# Patient Record
Sex: Male | Born: 1950
Health system: Southern US, Community
[De-identification: ages and names within clinical notes are randomized; demographics above are authoritative.]

## PROBLEM LIST (undated history)

## (undated) HISTORY — PX: COLONOSCOPY: SHX174

## (undated) HISTORY — PX: EYE SURGERY: SHX253

## (undated) HISTORY — PX: KNEE ARTHROSCOPY: SHX127

---

## 2006-05-21 ENCOUNTER — Ambulatory Visit: Payer: Self-pay | Admitting: Internal Medicine

## 2006-05-21 LAB — CONVERTED CEMR LAB
AST: 23 units/L (ref 0–37)
Albumin: 4 g/dL (ref 3.5–5.2)
Basophils Absolute: 0 10*3/uL (ref 0.0–0.1)
CO2: 31 meq/L (ref 19–32)
Cholesterol: 221 mg/dL (ref 0–200)
Creatinine, Ser: 0.9 mg/dL (ref 0.4–1.5)
GFR calc non Af Amer: 94 mL/min
HCT: 45.8 % (ref 39.0–52.0)
HDL: 70.8 mg/dL (ref >39.0)
MCHC: 32.7 g/dL (ref 30.0–36.0)
Monocytes Relative: 10.2 % (ref 3.0–11.0)
Neutro Abs: 3.4 10*3/uL (ref 1.4–7.7)
PSA: 1.2 ng/mL (ref 0.10–4.00)
Platelets: 226 10*3/uL (ref 150–400)
RBC: 4.82 M/uL (ref 4.22–5.81)
RDW: 12.9 % (ref 11.5–14.6)
Sodium: 140 meq/L (ref 135–145)
Total Bilirubin: 0.7 mg/dL (ref 0.3–1.2)
VLDL: 13 mg/dL (ref 0–40)

## 2006-05-28 ENCOUNTER — Ambulatory Visit: Payer: Self-pay | Admitting: Internal Medicine

## 2006-12-07 DIAGNOSIS — IMO0002 Reserved for concepts with insufficient information to code with codable children: Secondary | ICD-10-CM | POA: Insufficient documentation

## 2007-01-05 ENCOUNTER — Ambulatory Visit: Payer: Self-pay | Admitting: Family Medicine

## 2007-06-30 ENCOUNTER — Ambulatory Visit (HOSPITAL_BASED_OUTPATIENT_CLINIC_OR_DEPARTMENT_OTHER): Admission: RE | Admit: 2007-06-30 | Discharge: 2007-06-30 | Payer: Self-pay | Admitting: Orthopedic Surgery

## 2007-07-06 ENCOUNTER — Telehealth: Payer: Self-pay | Admitting: Internal Medicine

## 2010-07-14 ENCOUNTER — Emergency Department: Payer: Self-pay | Admitting: Emergency Medicine

## 2010-09-17 NOTE — Op Note (Signed)
NAME:  Daniel Velasquez, Daniel Velasquez               ACCOUNT NO.:  [id number]   MEDICAL RECORD NO.:  [id number]          PATIENT TYPE:  AMB   LOCATION:  [facility]                         FACILITY:  [facility]   PHYSICIAN:  Ollen Gross, M.D.    DATE OF BIRTH:  03-29-1951   DATE OF PROCEDURE:  06/30/2007  DATE OF DISCHARGE:                               OPERATIVE REPORT   PREOPERATIVE DIAGNOSIS:  Right knee medial meniscal tear.   POSTOPERATIVE DIAGNOSIS:  Right knee medial meniscal tear.   PROCEDURE:  Right knee arthroscopy with medial meniscal debridement.   SURGEON:  Ollen Gross, M.D.   ASSISTANT:  None.   ANESTHESIA:  General.   BLOOD LOSS:  Minimal.   DRAINS:  None.   COMPLICATIONS:  None.   CONDITION:  Stable to recovery room.   CLINICAL NOTE:  [name] is a 60 year old male with several month history of  right knee pain and mechanical symptoms.  Exam and history suggested  medial meniscal tear confirmed by MRI.  He presents now for arthroscopy  and debridement.   PROCEDURE IN DETAIL:  After the successful administration of general  anesthetic, a tourniquet is placed high on the right thigh and right  lower extremity prepped and draped in the usual sterile fashion.  Standard superomedial and inferolateral incisions are made.  Inflow  cannula passed superomedial and camera passed inferolateral.  Arthroscopic visualization proceeds.  Undersurface of  the patella and  trochlea look normal.  The medial and lateral gutters are visualized,  there are no loose bodies.  Flexion and valgus force is applied to the  knee and medial compartment is entered.  There is a very bad tear in the  body and posterior horn of the medial meniscus with a small bucket  handle component which is flipped underneath the meniscus posteriorly.  The chondral surfaces look normal.  Flexion and valgus force applied to  the knee and the medial compartment is entered.  Small incision is made  and dilator is placed.  The  meniscus is debrided back to a stable base  and the bucket handle component removed.  I used the baskets and a 4.2  mm shaver to stabilize the meniscus and then sealed off the edge with  the ArthroCare device.  It is found to be stable throughout.  The  chondral surfaces remain normal in appearance.  Intercondylar notch is  visualized and ACL exhibited no further tears, loose bodies or chondral  defects noted.  Arthroscopic equipment is  removed from the inferior portals which are closed with interrupted 4-0  nylon.  I injected 20 mL of 25% Marcaine with epinephrine  through the  inflow cannula, then that is removed and that portal closed with nylon.  Bulky sterile dressings applied and he is awakened and transported to  recovery in stable condition.      Ollen Gross, M.D.  Electronically Signed     FA/MEDQ  D:  06/30/2007  T:  07/01/2007  Job:  [id number]

## 2010-09-20 NOTE — Assessment & Plan Note (Signed)
[facility] OFFICE NOTE   WOODS, GANGEMI                        MRN:          [id number]  DATE:05/28/2006                            DOB:          04-May-1951    A 60 year old gentleman who is seen today to establish with our  practice.  He has enjoyed reasonably good health.  A number of years ago  did have a ruptured lumbar disk.  For the past 5 years or so, this has  been much improved.  He does have a prescription for p.r.n. oxycodone.  He has a history of a systolic murmur in the past.  He has had a remote  tonsillectomy in 1959.  He has no known allergies.   MEDICAL REGIMEN:  1. Oxycodone 5 mg p.r.n.  2. Famvir 125 p.r.n.  3. Patanol eye drops.   FAMILY HISTORY:  Both parents are living in their mid 37s.  Father with  osteoarthritis.  Mother with hypercholesterolemia.  Paternal grandfather  died of lung cancer.  One brother has recurrent DVTs on chronic  Coumadin.   EXAM:  Revealed a healthy-appearing fit male in no acute distress.  Blood pressure was 120/78.  FUNDI, EARS, NOSE, AND THROAT:  Clear.  NECK:  No adenopathy or bruits.  CHEST:  Clear.  CARDIOVASCULAR:  S1 and S2 are normal.  There is no audible murmur.  ABDOMEN:  Benign.  No organomegaly.  EXTERNAL GENITALIA:  Normal.  RECTAL:  Prostate small and benign.  Stool heme-negative.  EXTREMITIES:  Full peripheral pulses.  No edema.   IMPRESSION:  1. Intermittent low back pain.  2. Mild hypercholesterolemia with a total cholesterol of 221, HDL of      70.8.   DISPOSITION:  Medical regimen unchanged.  He has had screening  colonoscopy in 2006.  Will return in 1 year or p.r.n.     Gordy Savers, MD  Electronically Signed    PFK/MedQ  DD: 05/28/2006  DT: 05/28/2006  Job #: [id number]

## 2014-03-02 ENCOUNTER — Ambulatory Visit
Admission: RE | Admit: 2014-03-02 | Discharge: 2014-03-02 | Disposition: A | Discharge status: Home / Self Care | Payer: BC Managed Care – PPO | Source: Ambulatory Visit | Attending: Physician Assistant | Admitting: Physician Assistant

## 2014-03-02 ENCOUNTER — Other Ambulatory Visit: Payer: Self-pay | Admitting: Physician Assistant

## 2014-03-02 DIAGNOSIS — R079 Chest pain, unspecified: Secondary | ICD-10-CM

## 2017-12-02 ENCOUNTER — Encounter: Payer: Self-pay | Admitting: Gastroenterology

## 2018-04-08 DIAGNOSIS — L570 Actinic keratosis: Secondary | ICD-10-CM | POA: Diagnosis not present

## 2018-04-08 DIAGNOSIS — L218 Other seborrheic dermatitis: Secondary | ICD-10-CM | POA: Diagnosis not present

## 2018-04-08 DIAGNOSIS — L821 Other seborrheic keratosis: Secondary | ICD-10-CM | POA: Diagnosis not present

## 2018-04-08 DIAGNOSIS — D225 Melanocytic nevi of trunk: Secondary | ICD-10-CM | POA: Diagnosis not present

## 2018-05-17 DIAGNOSIS — H43391 Other vitreous opacities, right eye: Secondary | ICD-10-CM | POA: Diagnosis not present

## 2018-05-17 DIAGNOSIS — H33322 Round hole, left eye: Secondary | ICD-10-CM | POA: Diagnosis not present

## 2018-05-17 DIAGNOSIS — H4312 Vitreous hemorrhage, left eye: Secondary | ICD-10-CM | POA: Diagnosis not present

## 2018-05-17 DIAGNOSIS — H33022 Retinal detachment with multiple breaks, left eye: Secondary | ICD-10-CM | POA: Diagnosis not present

## 2018-05-19 DIAGNOSIS — H33322 Round hole, left eye: Secondary | ICD-10-CM | POA: Diagnosis not present

## 2018-08-06 ENCOUNTER — Other Ambulatory Visit: Payer: Self-pay | Admitting: Gastroenterology

## 2018-08-06 DIAGNOSIS — R109 Unspecified abdominal pain: Secondary | ICD-10-CM

## 2018-08-10 ENCOUNTER — Other Ambulatory Visit: Payer: Self-pay

## 2018-08-10 ENCOUNTER — Ambulatory Visit
Admission: RE | Admit: 2018-08-10 | Discharge: 2018-08-10 | Disposition: A | Discharge status: Home / Self Care | Payer: Medicare Other | Source: Ambulatory Visit | Attending: Gastroenterology | Admitting: Gastroenterology

## 2018-08-10 DIAGNOSIS — R109 Unspecified abdominal pain: Secondary | ICD-10-CM

## 2018-08-10 DIAGNOSIS — K5730 Diverticulosis of large intestine without perforation or abscess without bleeding: Secondary | ICD-10-CM | POA: Diagnosis not present

## 2018-08-10 MED ORDER — IOPAMIDOL (ISOVUE-300) INJECTION 61%
100.0000 mL | Freq: Once | INTRAVENOUS | Status: AC | PRN
  Administered 2018-08-10: 09:00:00 100 mL via INTRAVENOUS

## 2018-08-25 DIAGNOSIS — K5792 Diverticulitis of intestine, part unspecified, without perforation or abscess without bleeding: Secondary | ICD-10-CM | POA: Diagnosis not present

## 2018-08-27 DIAGNOSIS — R339 Retention of urine, unspecified: Secondary | ICD-10-CM | POA: Diagnosis not present

## 2018-09-07 DIAGNOSIS — H4312 Vitreous hemorrhage, left eye: Secondary | ICD-10-CM | POA: Diagnosis not present

## 2018-09-07 DIAGNOSIS — H43391 Other vitreous opacities, right eye: Secondary | ICD-10-CM | POA: Diagnosis not present

## 2018-09-07 DIAGNOSIS — H33022 Retinal detachment with multiple breaks, left eye: Secondary | ICD-10-CM | POA: Diagnosis not present

## 2018-09-07 DIAGNOSIS — H33322 Round hole, left eye: Secondary | ICD-10-CM | POA: Diagnosis not present

## 2018-09-29 DIAGNOSIS — L219 Seborrheic dermatitis, unspecified: Secondary | ICD-10-CM | POA: Diagnosis not present

## 2018-09-29 DIAGNOSIS — M545 Low back pain: Secondary | ICD-10-CM | POA: Diagnosis not present

## 2018-09-29 DIAGNOSIS — Z125 Encounter for screening for malignant neoplasm of prostate: Secondary | ICD-10-CM | POA: Diagnosis not present

## 2018-09-29 DIAGNOSIS — E7800 Pure hypercholesterolemia, unspecified: Secondary | ICD-10-CM | POA: Diagnosis not present

## 2018-10-07 DIAGNOSIS — H33022 Retinal detachment with multiple breaks, left eye: Secondary | ICD-10-CM | POA: Diagnosis not present

## 2018-10-07 DIAGNOSIS — H4312 Vitreous hemorrhage, left eye: Secondary | ICD-10-CM | POA: Diagnosis not present

## 2018-10-07 DIAGNOSIS — H43812 Vitreous degeneration, left eye: Secondary | ICD-10-CM | POA: Diagnosis not present

## 2018-10-07 DIAGNOSIS — Z125 Encounter for screening for malignant neoplasm of prostate: Secondary | ICD-10-CM | POA: Diagnosis not present

## 2018-10-07 DIAGNOSIS — E7800 Pure hypercholesterolemia, unspecified: Secondary | ICD-10-CM | POA: Diagnosis not present

## 2018-10-07 DIAGNOSIS — H33322 Round hole, left eye: Secondary | ICD-10-CM | POA: Diagnosis not present

## 2018-10-07 DIAGNOSIS — H43391 Other vitreous opacities, right eye: Secondary | ICD-10-CM | POA: Diagnosis not present

## 2018-10-28 DIAGNOSIS — L570 Actinic keratosis: Secondary | ICD-10-CM | POA: Diagnosis not present

## 2018-10-28 DIAGNOSIS — D485 Neoplasm of uncertain behavior of skin: Secondary | ICD-10-CM | POA: Diagnosis not present

## 2018-10-28 DIAGNOSIS — L821 Other seborrheic keratosis: Secondary | ICD-10-CM | POA: Diagnosis not present

## 2018-10-28 DIAGNOSIS — D225 Melanocytic nevi of trunk: Secondary | ICD-10-CM | POA: Diagnosis not present

## 2018-10-28 DIAGNOSIS — D1801 Hemangioma of skin and subcutaneous tissue: Secondary | ICD-10-CM | POA: Diagnosis not present

## 2018-10-28 DIAGNOSIS — L989 Disorder of the skin and subcutaneous tissue, unspecified: Secondary | ICD-10-CM | POA: Diagnosis not present

## 2019-01-20 DIAGNOSIS — L438 Other lichen planus: Secondary | ICD-10-CM | POA: Diagnosis not present

## 2019-01-20 DIAGNOSIS — L814 Other melanin hyperpigmentation: Secondary | ICD-10-CM | POA: Diagnosis not present

## 2019-01-20 DIAGNOSIS — L111 Transient acantholytic dermatosis [Grover]: Secondary | ICD-10-CM | POA: Diagnosis not present

## 2019-01-20 DIAGNOSIS — L565 Disseminated superficial actinic porokeratosis (DSAP): Secondary | ICD-10-CM | POA: Diagnosis not present

## 2019-01-20 DIAGNOSIS — D225 Melanocytic nevi of trunk: Secondary | ICD-10-CM | POA: Diagnosis not present

## 2019-01-20 DIAGNOSIS — D1801 Hemangioma of skin and subcutaneous tissue: Secondary | ICD-10-CM | POA: Diagnosis not present

## 2019-01-20 DIAGNOSIS — L821 Other seborrheic keratosis: Secondary | ICD-10-CM | POA: Diagnosis not present

## 2019-04-04 DIAGNOSIS — E7800 Pure hypercholesterolemia, unspecified: Secondary | ICD-10-CM | POA: Diagnosis not present

## 2019-04-04 DIAGNOSIS — M545 Low back pain: Secondary | ICD-10-CM | POA: Diagnosis not present

## 2019-04-04 DIAGNOSIS — L219 Seborrheic dermatitis, unspecified: Secondary | ICD-10-CM | POA: Diagnosis not present

## 2019-04-04 DIAGNOSIS — K4090 Unilateral inguinal hernia, without obstruction or gangrene, not specified as recurrent: Secondary | ICD-10-CM | POA: Diagnosis not present

## 2019-08-04 DIAGNOSIS — H2513 Age-related nuclear cataract, bilateral: Secondary | ICD-10-CM | POA: Diagnosis not present

## 2019-08-04 DIAGNOSIS — H35413 Lattice degeneration of retina, bilateral: Secondary | ICD-10-CM | POA: Diagnosis not present

## 2019-08-04 DIAGNOSIS — H40013 Open angle with borderline findings, low risk, bilateral: Secondary | ICD-10-CM | POA: Diagnosis not present

## 2019-08-04 DIAGNOSIS — H33312 Horseshoe tear of retina without detachment, left eye: Secondary | ICD-10-CM | POA: Diagnosis not present

## 2019-10-17 DIAGNOSIS — L509 Urticaria, unspecified: Secondary | ICD-10-CM | POA: Diagnosis not present

## 2019-10-25 DIAGNOSIS — M545 Low back pain: Secondary | ICD-10-CM | POA: Diagnosis not present

## 2019-10-25 DIAGNOSIS — Z125 Encounter for screening for malignant neoplasm of prostate: Secondary | ICD-10-CM | POA: Diagnosis not present

## 2019-10-25 DIAGNOSIS — E7800 Pure hypercholesterolemia, unspecified: Secondary | ICD-10-CM | POA: Diagnosis not present

## 2019-10-25 DIAGNOSIS — L309 Dermatitis, unspecified: Secondary | ICD-10-CM | POA: Diagnosis not present

## 2020-02-09 DIAGNOSIS — D692 Other nonthrombocytopenic purpura: Secondary | ICD-10-CM | POA: Diagnosis not present

## 2020-02-09 DIAGNOSIS — L814 Other melanin hyperpigmentation: Secondary | ICD-10-CM | POA: Diagnosis not present

## 2020-02-09 DIAGNOSIS — L565 Disseminated superficial actinic porokeratosis (DSAP): Secondary | ICD-10-CM | POA: Diagnosis not present

## 2020-02-09 DIAGNOSIS — L570 Actinic keratosis: Secondary | ICD-10-CM | POA: Diagnosis not present

## 2020-02-09 DIAGNOSIS — C44319 Basal cell carcinoma of skin of other parts of face: Secondary | ICD-10-CM | POA: Diagnosis not present

## 2020-04-07 DIAGNOSIS — Z0120 Encounter for dental examination and cleaning without abnormal findings: Secondary | ICD-10-CM | POA: Diagnosis not present

## 2020-04-12 DIAGNOSIS — Z0120 Encounter for dental examination and cleaning without abnormal findings: Secondary | ICD-10-CM | POA: Diagnosis not present

## 2020-04-19 DIAGNOSIS — H25013 Cortical age-related cataract, bilateral: Secondary | ICD-10-CM | POA: Diagnosis not present

## 2020-04-19 DIAGNOSIS — H2513 Age-related nuclear cataract, bilateral: Secondary | ICD-10-CM | POA: Diagnosis not present

## 2020-04-19 DIAGNOSIS — H04123 Dry eye syndrome of bilateral lacrimal glands: Secondary | ICD-10-CM | POA: Diagnosis not present

## 2020-04-19 DIAGNOSIS — H40013 Open angle with borderline findings, low risk, bilateral: Secondary | ICD-10-CM | POA: Diagnosis not present

## 2020-05-15 DIAGNOSIS — L219 Seborrheic dermatitis, unspecified: Secondary | ICD-10-CM | POA: Diagnosis not present

## 2020-05-15 DIAGNOSIS — Z0000 Encounter for general adult medical examination without abnormal findings: Secondary | ICD-10-CM | POA: Diagnosis not present

## 2020-05-15 DIAGNOSIS — M545 Low back pain, unspecified: Secondary | ICD-10-CM | POA: Diagnosis not present

## 2020-05-15 DIAGNOSIS — K4090 Unilateral inguinal hernia, without obstruction or gangrene, not specified as recurrent: Secondary | ICD-10-CM | POA: Diagnosis not present

## 2020-06-26 ENCOUNTER — Encounter (INDEPENDENT_AMBULATORY_CARE_PROVIDER_SITE_OTHER): Payer: Self-pay | Admitting: Ophthalmology

## 2020-06-26 ENCOUNTER — Ambulatory Visit (INDEPENDENT_AMBULATORY_CARE_PROVIDER_SITE_OTHER): Payer: Medicare Other | Admitting: Ophthalmology

## 2020-06-26 ENCOUNTER — Other Ambulatory Visit: Payer: Self-pay

## 2020-06-26 DIAGNOSIS — H4312 Vitreous hemorrhage, left eye: Secondary | ICD-10-CM

## 2020-06-26 DIAGNOSIS — H35411 Lattice degeneration of retina, right eye: Secondary | ICD-10-CM

## 2020-06-26 DIAGNOSIS — H33022 Retinal detachment with multiple breaks, left eye: Secondary | ICD-10-CM

## 2020-06-26 DIAGNOSIS — H2513 Age-related nuclear cataract, bilateral: Secondary | ICD-10-CM | POA: Diagnosis not present

## 2020-06-26 DIAGNOSIS — H33322 Round hole, left eye: Secondary | ICD-10-CM | POA: Insufficient documentation

## 2020-06-26 DIAGNOSIS — H2512 Age-related nuclear cataract, left eye: Secondary | ICD-10-CM | POA: Insufficient documentation

## 2020-06-26 NOTE — Assessment & Plan Note (Signed)
This condition resolved from the past not present at this time

## 2020-06-26 NOTE — Assessment & Plan Note (Signed)
Retina attached, no new findings, good buckle superiorly no new breaks

## 2020-06-26 NOTE — Progress Notes (Addendum)
06/26/2020     CHIEF COMPLAINT Patient presents for Retina Follow Up (Pt referred for cataract sx clearance by Dr. Serita Kyle was last seen in 10/2018. Pt c/o blurry vision. He suspects it is due to OS cataract. )   HISTORY OF PRESENT ILLNESS: Daniel Velasquez is a 70 y.o. male who presents to the clinic today for:   HPI    Retina Follow Up    Patient presents with  Retinal Break/Detachment.  In left eye.  Severity is moderate.  Duration of 2 years.  Since onset it is stable.  I, the attending physician,  performed the HPI with the patient and updated documentation appropriately. Additional comments: Pt referred for cataract sx clearance by Dr. Herbert Deaner. Pt was last seen in 10/2018. Pt c/o blurry vision. He suspects it is due to OS cataract.        Last edited by Tilda Franco on 06/26/2020  2:10 PM. (History)      Referring physician: No referring provider defined for this encounter.  HISTORICAL INFORMATION:   Selected notes from the MEDICAL RECORD NUMBER       CURRENT MEDICATIONS: Current Outpatient Medications (Ophthalmic Drugs)  Medication Sig  . cycloSPORINE (RESTASIS) 0.05 % ophthalmic emulsion 1 drop in each eye   No current facility-administered medications for this visit. (Ophthalmic Drugs)   No current outpatient medications on file. (Other)   No current facility-administered medications for this visit. (Other)      REVIEW OF SYSTEMS:    ALLERGIES Allergies  Allergen Reactions  . Ibuprofen     PAST MEDICAL HISTORY History reviewed. No pertinent past medical history. History reviewed. No pertinent surgical history.  FAMILY HISTORY History reviewed. No pertinent family history.  SOCIAL HISTORY Social History   Tobacco Use  . Smoking status: Light Tobacco Smoker         OPHTHALMIC EXAM:  Base Eye Exam    Visual Acuity (Snellen - Linear)      Right Left   Dist cc 20/80 -2 20/30   Dist ph cc 20/50 -2        Tonometry (Tonopen, 2:17  PM)      Right Left   Pressure 18 15       Pupils      Pupils Dark Light Shape React APD   Right PERRL 4 3 Round Brisk None   Left PERRL 4 3 Round Brisk None       Visual Fields (Counting fingers)      Left Right    Full Full       Neuro/Psych    Oriented x3: Yes   Mood/Affect: Normal       Dilation    Both eyes: 1.0% Mydriacyl, 2.5% Phenylephrine @ 2:17 PM        Slit Lamp and Fundus Exam    External Exam      Right Left   External Normal Normal       Slit Lamp Exam      Right Left   Lids/Lashes Normal Normal   Conjunctiva/Sclera White and quiet White and quiet   Cornea Clear Clear   Anterior Chamber Deep and quiet Deep and quiet   Iris Round and reactive Round and reactive   Lens 3+ Nuclear sclerosis 2+ Nuclear sclerosis   Anterior Vitreous Normal Normal       Fundus Exam      Right Left   Posterior Vitreous Posterior vitreous detachment Posterior vitreous detachment, Central vitreous floaters  Disc Normal Normal   C/D Ratio 0.4 0.45   Macula Normal Normal   Vessels Normal Normal   Periphery Degeneration inferotemporal at 7 and 8 position, no breaks Good buckle, attached 360, good cryo superiorly          IMAGING AND PROCEDURES  Imaging and Procedures for 06/26/20  Color Fundus Photography TopCon - OU - Both Eyes       Right Eye Progression has been stable. Disc findings include normal observations. Macula : normal observations. Vessels : normal observations.   Left Eye Progression has been stable. Disc findings include normal observations. Macula : normal observations. Vessels : normal observations.   Notes Lattice degeneration inferotemporal right eye  Less, previous scleral buckle with retinal cryopexy superiorly                ASSESSMENT/PLAN:  Lattice degeneration, right eye Lattice degeneration right eye with a fellow eye retinal detachment and with needing cataract surgery in the right eye in the near future.  Offered  laser retinopexy as a prophylactic measure in this right eye OS to minimize the risk of developing a retinal detachment in the right eye.  He does not have a driver today, will schedule  Retinal detachment of left eye with multiple breaks Retina attached, no new findings, good buckle superiorly no new breaks  Nuclear sclerotic cataract of both eyes Dense nuclear sclerosis OU much worse on clinical biomicroscopic examination OD today.  Vitreous hemorrhage of left eye ([place]) This condition resolved from the past not present at this time      ICD-10-CM   1. Lattice degeneration, right eye  H35.411   2. Retinal detachment of left eye with multiple breaks  H33.022   3. Nuclear sclerotic cataract of both eyes  H25.13   4. Vitreous hemorrhage of left eye (HCC)  H43.12 Color Fundus Photography TopCon - OU - Both Eyes  5. Round hole of left eye  H33.322 Color Fundus Photography TopCon - OU - Both Eyes    1.  Lattice degeneration right eye, with fellow eye retinal detachment, will add laser retinopexy to the identified areas today.  2.  OS previous history of retinal detachment, no new findings  3.  OU with nuclear sclerotic cataract, OU deserves to have cataract surgery with intraocular lens placement particularly the right eye due to lens opacity seen today  Follow-up Dr. Baldemar Lenis as scheduled  Ophthalmic Meds Ordered this visit:  No orders of the defined types were placed in this encounter.      Return in about 1 week (around 07/03/2020) for dilate, COLOR FP, RETINOPEXY, OD.  There are no Patient Instructions on file for this visit.   Explained the diagnoses, plan, and follow up with the patient and they expressed understanding.  Patient expressed understanding of the importance of proper follow up care.   Clent Demark Rankin M.D. Diseases & Surgery of the Retina and Vitreous Retina & Diabetic [place] 06/26/20     Abbreviations: M myopia (nearsighted); A astigmatism; H  hyperopia (farsighted); P presbyopia; Mrx spectacle prescription;  CTL contact lenses; OD right eye; OS left eye; OU both eyes  XT exotropia; ET esotropia; PEK punctate epithelial keratitis; PEE punctate epithelial erosions; DES dry eye syndrome; MGD meibomian gland dysfunction; ATs artificial tears; PFAT's preservative free artificial tears; [place] nuclear sclerotic cataract; PSC posterior subcapsular cataract; ERM epi-retinal membrane; PVD posterior vitreous detachment; RD retinal detachment; DM diabetes mellitus; DR diabetic retinopathy; NPDR non-proliferative diabetic retinopathy; PDR proliferative  diabetic retinopathy; CSME clinically significant macular edema; DME diabetic macular edema; dbh dot blot hemorrhages; CWS cotton wool spot; POAG primary open angle glaucoma; C/D cup-to-disc ratio; HVF humphrey visual field; GVF goldmann visual field; OCT optical coherence tomography; IOP intraocular pressure; BRVO Branch retinal vein occlusion; CRVO central retinal vein occlusion; CRAO central retinal artery occlusion; BRAO branch retinal artery occlusion; RT retinal tear; SB scleral buckle; PPV pars plana vitrectomy; VH Vitreous hemorrhage; PRP panretinal laser photocoagulation; IVK intravitreal kenalog; VMT vitreomacular traction; MH Macular hole;  NVD neovascularization of the disc; NVE neovascularization elsewhere; AREDS age related eye disease study; ARMD age related macular degeneration; POAG primary open angle glaucoma; EBMD epithelial/anterior basement membrane dystrophy; ACIOL anterior chamber intraocular lens; IOL intraocular lens; PCIOL posterior chamber intraocular lens; Phaco/IOL phacoemulsification with intraocular lens placement; [place] photorefractive keratectomy; LASIK laser assisted in situ keratomileusis; HTN hypertension; DM diabetes mellitus; COPD chronic obstructive pulmonary disease

## 2020-06-26 NOTE — Assessment & Plan Note (Signed)
Dense nuclear sclerosis OU much worse on clinical biomicroscopic examination OD today.

## 2020-06-26 NOTE — Assessment & Plan Note (Signed)
Lattice degeneration right eye with a fellow eye retinal detachment and with needing cataract surgery in the right eye in the near future.  Offered laser retinopexy as a prophylactic measure in this right eye OS to minimize the risk of developing a retinal detachment in the right eye.  He does not have a driver today, will schedule

## 2020-07-03 ENCOUNTER — Ambulatory Visit (INDEPENDENT_AMBULATORY_CARE_PROVIDER_SITE_OTHER): Payer: Medicare Other | Admitting: Ophthalmology

## 2020-07-03 ENCOUNTER — Encounter (INDEPENDENT_AMBULATORY_CARE_PROVIDER_SITE_OTHER): Payer: Self-pay | Admitting: Ophthalmology

## 2020-07-03 ENCOUNTER — Other Ambulatory Visit: Payer: Self-pay

## 2020-07-03 DIAGNOSIS — H35411 Lattice degeneration of retina, right eye: Secondary | ICD-10-CM

## 2020-07-03 NOTE — Assessment & Plan Note (Signed)
Degeneration right eye inferotemporal, history of retinal eye detachment, treatment today for prophylaxis retinal tear or detachment

## 2020-07-03 NOTE — Progress Notes (Signed)
07/03/2020     CHIEF COMPLAINT Patient presents for Retina Follow Up (Retinopexy OD and FP/Pt denies issues or complaints.)   HISTORY OF PRESENT ILLNESS: Daniel Velasquez is a 70 y.o. male who presents to the clinic today for:   HPI    Retina Follow Up    Diagnosis: Lattice Degeneration.  In right eye.  Severity is moderate.  Duration of 1 week.  Since onset it is stable.  I, the attending physician,  performed the HPI with the patient and updated documentation appropriately. Additional comments: Retinopexy OD and FP Pt denies issues or complaints.       Last edited by Tilda Franco on 07/03/2020 10:01 AM. (History)      Referring physician: Arlyss Queen, MD [address] [place],  [place] [address]  HISTORICAL INFORMATION:   Selected notes from the MEDICAL RECORD NUMBER       CURRENT MEDICATIONS: Current Outpatient Medications (Ophthalmic Drugs)  Medication Sig  . cycloSPORINE (RESTASIS) 0.05 % ophthalmic emulsion 1 drop in each eye   No current facility-administered medications for this visit. (Ophthalmic Drugs)   No current outpatient medications on file. (Other)   No current facility-administered medications for this visit. (Other)      REVIEW OF SYSTEMS:    ALLERGIES Allergies  Allergen Reactions  . Ibuprofen     PAST MEDICAL HISTORY History reviewed. No pertinent past medical history. History reviewed. No pertinent surgical history.  FAMILY HISTORY History reviewed. No pertinent family history.  SOCIAL HISTORY Social History   Tobacco Use  . Smoking status: Light Tobacco Smoker         OPHTHALMIC EXAM:  Base Eye Exam    Visual Acuity (Snellen - Linear)      Right Left   Dist cc 20/400 20/30 +2   Dist ph cc 20/40 -1    Correction: Glasses       Tonometry (Tonopen, 10:05 AM)      Right Left   Pressure 19 15       Neuro/Psych    Oriented x3: Yes   Mood/Affect: Normal       Dilation    Right eye: 1.0%  Mydriacyl, 2.5% Phenylephrine @ 10:05 AM        Slit Lamp and Fundus Exam    External Exam      Right Left   External Normal Normal       Slit Lamp Exam      Right Left   Lids/Lashes Normal Normal   Conjunctiva/Sclera White and quiet White and quiet   Cornea Clear Clear   Anterior Chamber Deep and quiet Deep and quiet   Iris Round and reactive Round and reactive   Lens 3+ Nuclear sclerosis 2+ Nuclear sclerosis   Anterior Vitreous Normal Normal          IMAGING AND PROCEDURES  Imaging and Procedures for 07/03/20  Color Fundus Photography Optos - OU - Both Eyes       Right Eye Progression has been stable. Disc findings include normal observations. Macula : normal observations. Vessels : normal observations. Periphery : lattice.   Left Eye Progression has been stable. Disc findings include normal observations. Macula : normal observations. Vessels : normal observations.   Notes Lattice degeneration 7-8 o'clock meridian, seen clinically not well depicted on color fundus photography today, Vitreous debris noted.  Retina attached       Repair Retinal Breaks, Laser - OD - Right Eye  Tear locations include inferior.   Time Out Confirmed correct patient, procedure, site, and patient consented.   Anesthesia Topical anesthesia was used. Anesthetic medications included Proparacaine 0.5%.   Laser Information The type of laser was diode. Color was yellow. The duration in seconds was 0.04. The spot size was 390 microns. Laser power was 260. Total spots was 342.   Post-op The patient tolerated the procedure well. There were no complications. The patient received written and verbal post procedure care education.   Notes Good retinopexy inferotemporal around region of lattice degeneration centered at 630, by 7-7 30,, meridians.  No complications good laser retinopexy                ASSESSMENT/PLAN:  Lattice degeneration, right eye Degeneration right eye  inferotemporal, history of retinal eye detachment, treatment today for prophylaxis retinal tear or detachment      ICD-10-CM   1. Lattice degeneration, right eye  H35.411 Color Fundus Photography Optos - OU - Both Eyes    Repair Retinal Breaks, Laser - OD - Right Eye    1.  History of fellow eye retinal detachment left eye, doing very well.  Retinal detachment with good acuity 2.  Laser retinopexy right eye today for retinal detachment retinal tear prophylaxis  3.  Ophthalmic Meds Ordered this visit:  No orders of the defined types were placed in this encounter.      Return in about 6 months (around 01/03/2021) for DILATE OU, COLOR FP.  There are no Patient Instructions on file for this visit.   Explained the diagnoses, plan, and follow up with the patient and they expressed understanding.  Patient expressed understanding of the importance of proper follow up care.   Clent Demark Rankin M.D. Diseases & Surgery of the Retina and Vitreous Retina & Diabetic [place] 07/03/20     Abbreviations: M myopia (nearsighted); A astigmatism; H hyperopia (farsighted); P presbyopia; Mrx spectacle prescription;  CTL contact lenses; OD right eye; OS left eye; OU both eyes  XT exotropia; ET esotropia; PEK punctate epithelial keratitis; PEE punctate epithelial erosions; DES dry eye syndrome; MGD meibomian gland dysfunction; ATs artificial tears; PFAT's preservative free artificial tears; [place] nuclear sclerotic cataract; PSC posterior subcapsular cataract; ERM epi-retinal membrane; PVD posterior vitreous detachment; RD retinal detachment; DM diabetes mellitus; DR diabetic retinopathy; NPDR non-proliferative diabetic retinopathy; PDR proliferative diabetic retinopathy; CSME clinically significant macular edema; DME diabetic macular edema; dbh dot blot hemorrhages; CWS cotton wool spot; POAG primary open angle glaucoma; C/D cup-to-disc ratio; HVF humphrey visual field; GVF goldmann visual field; OCT optical  coherence tomography; IOP intraocular pressure; BRVO Branch retinal vein occlusion; CRVO central retinal vein occlusion; CRAO central retinal artery occlusion; BRAO branch retinal artery occlusion; RT retinal tear; SB scleral buckle; PPV pars plana vitrectomy; VH Vitreous hemorrhage; PRP panretinal laser photocoagulation; IVK intravitreal kenalog; VMT vitreomacular traction; MH Macular hole;  NVD neovascularization of the disc; NVE neovascularization elsewhere; AREDS age related eye disease study; ARMD age related macular degeneration; POAG primary open angle glaucoma; EBMD epithelial/anterior basement membrane dystrophy; ACIOL anterior chamber intraocular lens; IOL intraocular lens; PCIOL posterior chamber intraocular lens; Phaco/IOL phacoemulsification with intraocular lens placement; [place] photorefractive keratectomy; LASIK laser assisted in situ keratomileusis; HTN hypertension; DM diabetes mellitus; COPD chronic obstructive pulmonary disease

## 2020-07-17 ENCOUNTER — Encounter (INDEPENDENT_AMBULATORY_CARE_PROVIDER_SITE_OTHER): Payer: Medicare Other | Admitting: Ophthalmology

## 2020-10-08 DIAGNOSIS — D225 Melanocytic nevi of trunk: Secondary | ICD-10-CM | POA: Diagnosis not present

## 2020-10-08 DIAGNOSIS — D2261 Melanocytic nevi of right upper limb, including shoulder: Secondary | ICD-10-CM | POA: Diagnosis not present

## 2020-10-08 DIAGNOSIS — Z85828 Personal history of other malignant neoplasm of skin: Secondary | ICD-10-CM | POA: Diagnosis not present

## 2020-10-08 DIAGNOSIS — L565 Disseminated superficial actinic porokeratosis (DSAP): Secondary | ICD-10-CM | POA: Diagnosis not present

## 2020-10-11 DIAGNOSIS — H40013 Open angle with borderline findings, low risk, bilateral: Secondary | ICD-10-CM | POA: Diagnosis not present

## 2020-10-11 DIAGNOSIS — H35413 Lattice degeneration of retina, bilateral: Secondary | ICD-10-CM | POA: Diagnosis not present

## 2020-10-16 IMAGING — CT CT ABDOMEN AND PELVIS WITH CONTRAST
1 of 3 series · 12 of 32 positions shown, 18 images · IV contrast (APPLIED)
Comparison: None.

CLINICAL DATA: Abdominal pain, possible diverticulitis, known
hernia

EXAM:
CT ABDOMEN AND PELVIS WITH CONTRAST
TECHNIQUE: Multidetector CT imaging of the abdomen and pelvis was performed
using the standard protocol following bolus administration of
intravenous contrast.
CONTRAST:  100mL 77SQU2-EVV IOPAMIDOL (77SQU2-EVV) INJECTION 61%,
additional oral enteric contrast

[Series 2: abd/pelvis w/cm · axial · 0.76mm/px · z∈[-434,-29]mm · 12 of 95 slices shown, 18 images]
[im 7/95  soft-tissue]
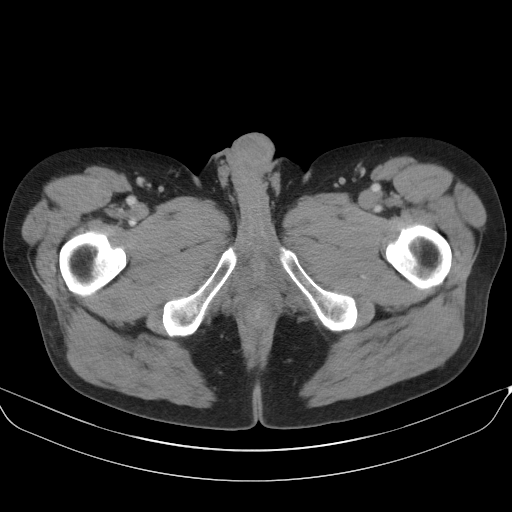
[im 7/95  bone]
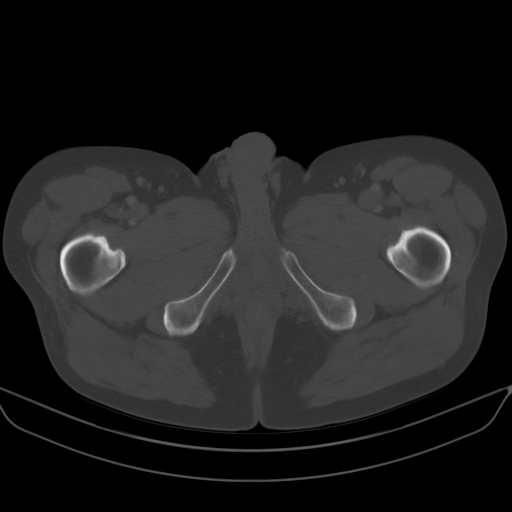
[im 14/95  soft-tissue]
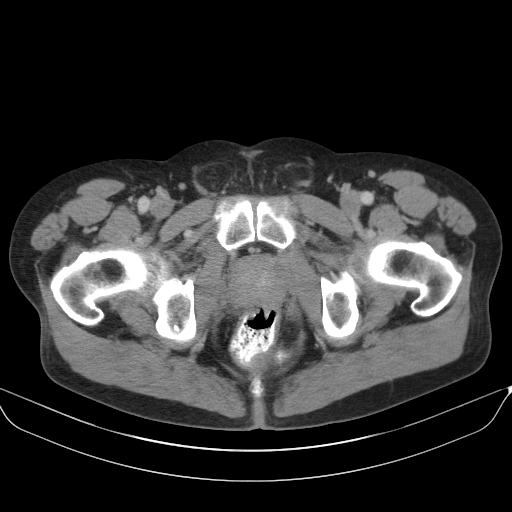
[im 21/95  soft-tissue]
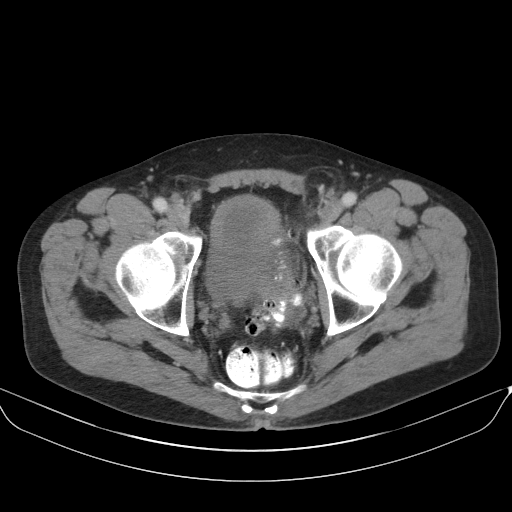
[im 27/95  soft-tissue]
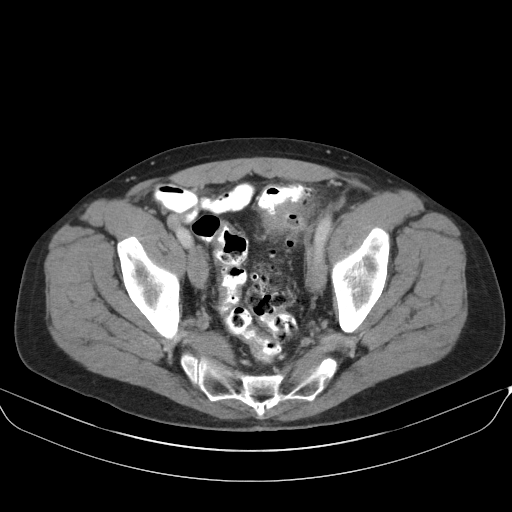
[im 34/95  soft-tissue]
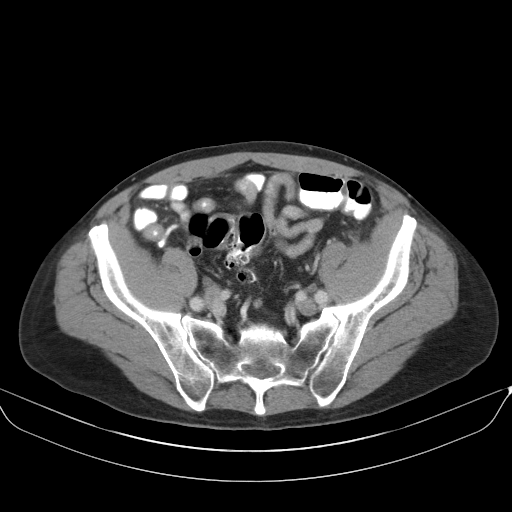
[im 41/95  soft-tissue]
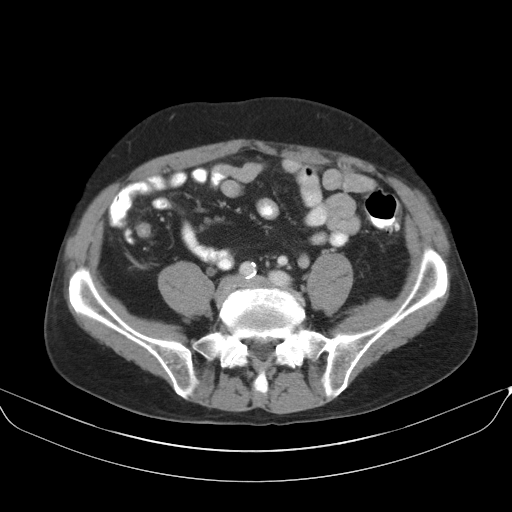
[im 54/95  soft-tissue]
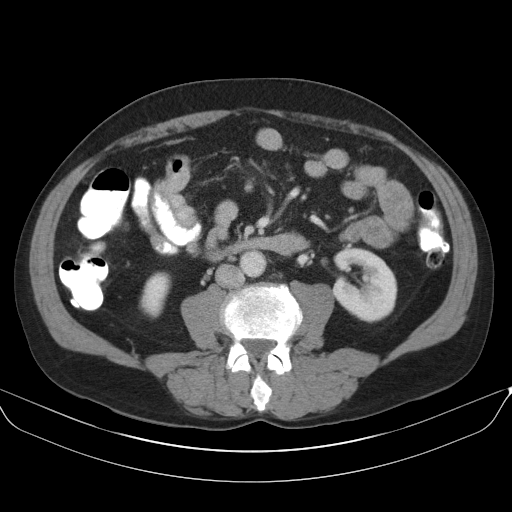
[im 61/95  soft-tissue]
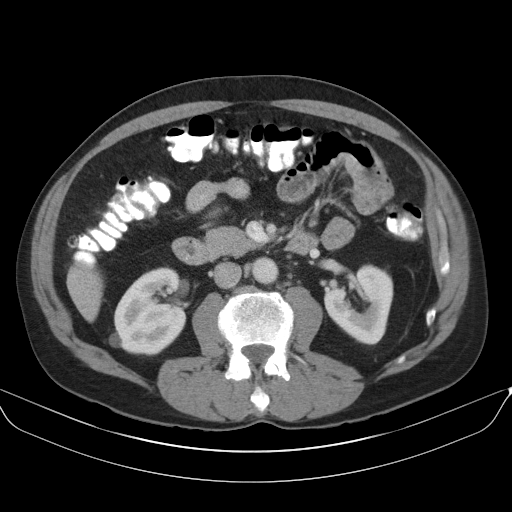
[im 68/95  soft-tissue]
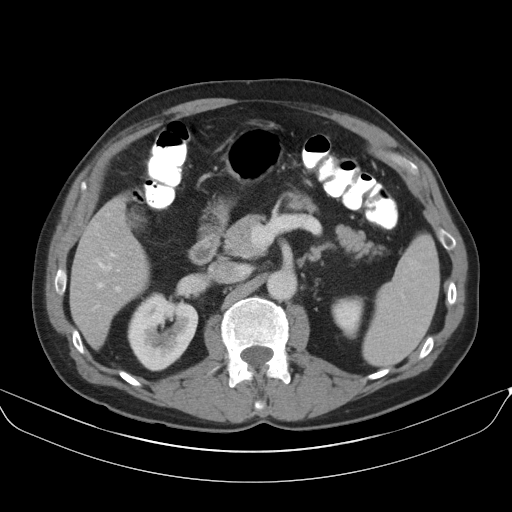
[im 68/95  lung]
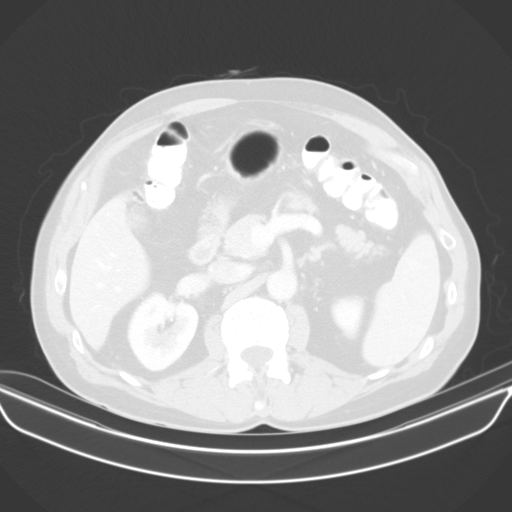
[im 68/95  bone]
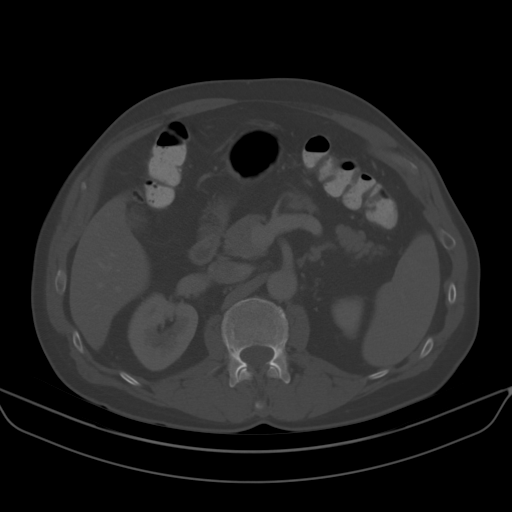
[im 74/95  soft-tissue]
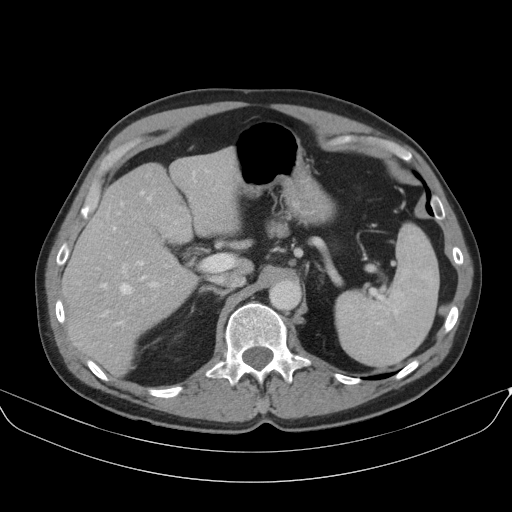
[im 74/95  lung]
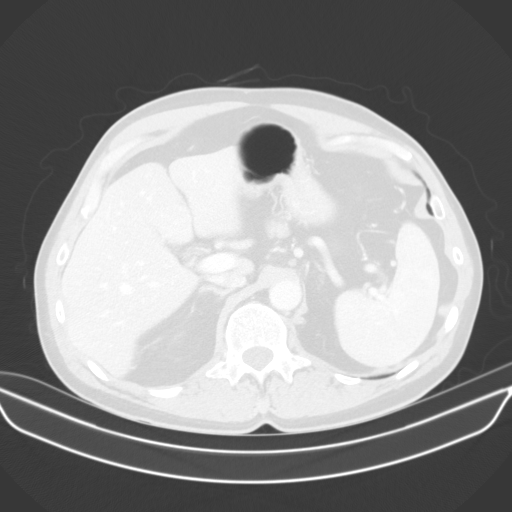
[im 81/95  soft-tissue]
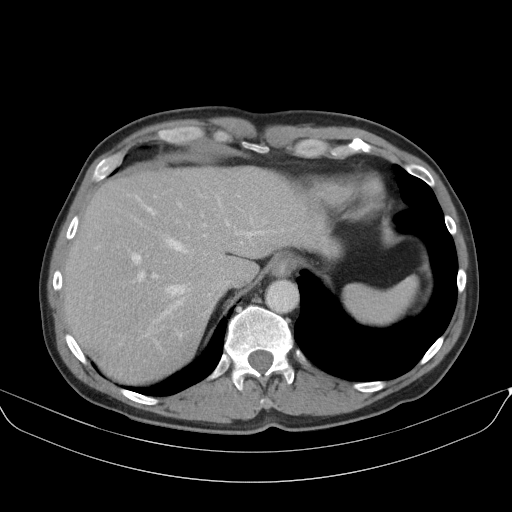
[im 81/95  lung]
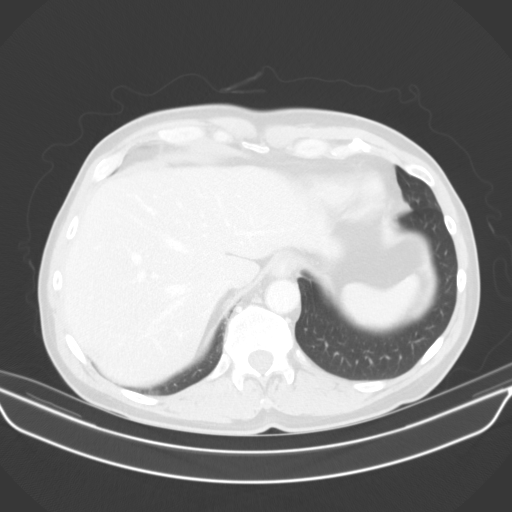
[im 88/95  soft-tissue]
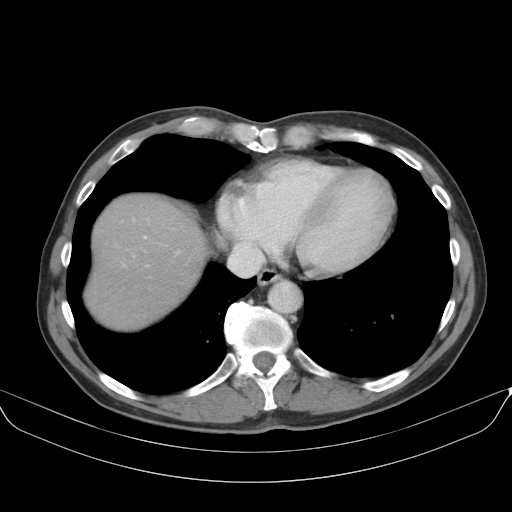
[im 88/95  lung]
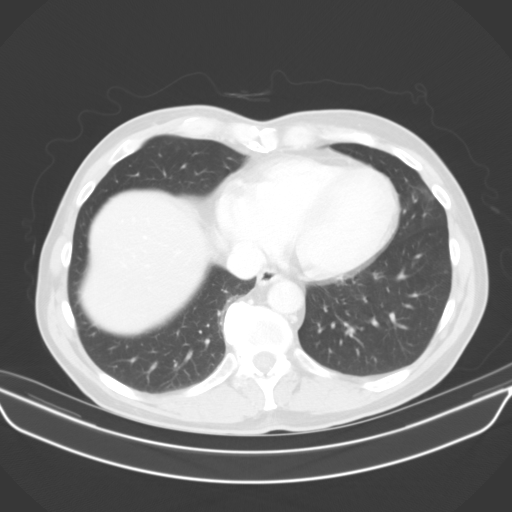

[12 of 32 positions shown; findings below may reference images not displayed]

FINDINGS: Lower chest: No acute abnormality.

Hepatobiliary: No focal liver abnormality is seen. No gallstones,
gallbladder wall thickening, or biliary dilatation.

Pancreas: Unremarkable. No pancreatic ductal dilatation or
surrounding inflammatory changes.

Spleen: Normal in size without focal abnormality.

Adrenals/Urinary Tract: Adrenal glands are unremarkable. Kidneys are
normal, without renal calculi, focal lesion, or hydronephrosis.
Thickening of the urinary bladder, likely due to chronic outlet
obstruction

Stomach/Bowel: Stomach is within normal limits. Appendix appears
normal. Pancolonic diverticulosis, which is severe in sigmoid, with
inflammatory thickening and stranding about the mid sigmoid (series
2, image 69). There may be a small forming phlegmon or abscess
measuring approximately 1.5 cm at the lateral aspect of the mid
sigmoid (series 2, image 69, series 3, image 43).

Vascular/Lymphatic: No significant vascular findings are present. No
enlarged abdominal or pelvic lymph nodes.

Reproductive: No mass or other abnormality.  Prostatomegaly.

Other: Small bilateral, left greater than right fat containing
inguinal hernias. No abdominopelvic ascites.

Musculoskeletal: No acute or significant osseous findings.
IMPRESSION: Pancolonic diverticulosis, which is severe in the sigmoid, with
inflammatory thickening and stranding about the mid sigmoid (series
2, image 69). There may be a small forming phlegmon or abscess
measuring approximately 1.5 cm at the lateral aspect of the mid
sigmoid (series 2, image 69, series 3, image 43). No evidence of
perforation.

These results will be called to the ordering clinician or
representative by the Radiologist Assistant, and communication
documented in the PACS or zVision Dashboard.

## 2020-11-23 DIAGNOSIS — E7800 Pure hypercholesterolemia, unspecified: Secondary | ICD-10-CM | POA: Diagnosis not present

## 2020-11-23 DIAGNOSIS — Z23 Encounter for immunization: Secondary | ICD-10-CM | POA: Diagnosis not present

## 2020-11-23 DIAGNOSIS — M5459 Other low back pain: Secondary | ICD-10-CM | POA: Diagnosis not present

## 2020-12-03 ENCOUNTER — Other Ambulatory Visit: Payer: Self-pay

## 2020-12-03 ENCOUNTER — Ambulatory Visit (INDEPENDENT_AMBULATORY_CARE_PROVIDER_SITE_OTHER): Payer: Medicare Other | Admitting: Ophthalmology

## 2020-12-03 ENCOUNTER — Encounter (INDEPENDENT_AMBULATORY_CARE_PROVIDER_SITE_OTHER): Payer: Self-pay | Admitting: Ophthalmology

## 2020-12-03 DIAGNOSIS — H2513 Age-related nuclear cataract, bilateral: Secondary | ICD-10-CM | POA: Diagnosis not present

## 2020-12-03 DIAGNOSIS — H35411 Lattice degeneration of retina, right eye: Secondary | ICD-10-CM | POA: Diagnosis not present

## 2020-12-03 DIAGNOSIS — H33022 Retinal detachment with multiple breaks, left eye: Secondary | ICD-10-CM

## 2020-12-03 DIAGNOSIS — H33322 Round hole, left eye: Secondary | ICD-10-CM | POA: Diagnosis not present

## 2020-12-03 NOTE — Assessment & Plan Note (Signed)
Dense cataract in the right eye moderately dense in the left eye each requiring surgical attention.  Surgery is scheduled scheduled with Dr. Baldemar Lenis in the very near future

## 2020-12-03 NOTE — Progress Notes (Signed)
12/03/2020     CHIEF COMPLAINT Patient presents for No chief complaint on file.   HISTORY OF PRESENT ILLNESS: Daniel Velasquez is a 70 y.o. male who presents to the clinic today for:     Referring physician: Monna Fam, MD [place],  [place] [address]  HISTORICAL INFORMATION:   Selected notes from the MEDICAL RECORD NUMBER       CURRENT MEDICATIONS: Current Outpatient Medications (Ophthalmic Drugs)  Medication Sig   cycloSPORINE (RESTASIS) 0.05 % ophthalmic emulsion 1 drop in each eye   No current facility-administered medications for this visit. (Ophthalmic Drugs)   No current outpatient medications on file. (Other)   No current facility-administered medications for this visit. (Other)      REVIEW OF SYSTEMS:    ALLERGIES Allergies  Allergen Reactions   Ibuprofen     PAST MEDICAL HISTORY No past medical history on file. No past surgical history on file.  FAMILY HISTORY No family history on file.  SOCIAL HISTORY Social History   Tobacco Use   Smoking status: Light Smoker         OPHTHALMIC EXAM:  Base Eye Exam     Visual Acuity (ETDRS)       Right Left   Dist cc 20/400 20/30 -1   Dist ph [place] 20/60     Correction: Glasses         Tonometry (Tonopen, 9:55 AM)       Right Left   Pressure 16 14         Neuro/Psych     Oriented x3: Yes   Mood/Affect: Normal         Dilation     Both eyes: 1.0% Mydriacyl, 2.5% Phenylephrine @ 9:55 AM           Slit Lamp and Fundus Exam     External Exam       Right Left   External Normal Normal         Slit Lamp Exam       Right Left   Lids/Lashes Normal Normal   Conjunctiva/Sclera White and quiet White and quiet   Cornea Clear Clear   Anterior Chamber Deep and quiet Deep and quiet   Iris Round and reactive Round and reactive   Lens 3+ Nuclear sclerosis 2+ Nuclear sclerosis   Anterior Vitreous Normal Normal         Fundus Exam       Right Left    Posterior Vitreous Posterior vitreous detachment Posterior vitreous detachment, Central vitreous floaters   Disc Normal Normal   C/D Ratio 0.4 0.45   Macula Normal Normal   Vessels Normal Normal   Periphery Degeneration inferotemporal at 7 and 8 position, no breaks Good buckle, attached 360, good cryo superiorlyNew atrophic: 6:00 meridian            IMAGING AND PROCEDURES  Imaging and Procedures for 12/03/20  OCT, Retina - OU - Both Eyes       Right Eye Quality was good. Scan locations included subfoveal. Central Foveal Thickness: 279. Progression has been stable. Findings include normal foveal contour.   Left Eye Quality was good. Scan locations included subfoveal. Central Foveal Thickness: 296. Progression has been stable. Findings include normal foveal contour.   Notes Cloudy media OU     Color Fundus Photography Optos - OU - Both Eyes       Right Eye Progression has been stable. Disc findings include normal observations. Macula :  normal observations. Vessels : normal observations. Periphery : lattice.   Left Eye Progression has been stable. Disc findings include normal observations. Macula : normal observations. Vessels : normal observations.   Notes Lattice degeneration 7-8 o'clock meridian, seen clinically not well depicted on color fundus photography today, Vitreous debris noted.  Retina attached  No retinal hole 6:00 meridian             ASSESSMENT/PLAN:  Nuclear sclerotic cataract of both eyes Dense cataract in the right eye moderately dense in the left eye each requiring surgical attention.  Surgery is scheduled scheduled with Dr. Baldemar Lenis in the very near future     ICD-10-CM   1. Retinal detachment of left eye with multiple breaks  H33.022 OCT, Retina - OU - Both Eyes    Color Fundus Photography Optos - OU - Both Eyes    2. Lattice degeneration, right eye  H35.411 OCT, Retina - OU - Both Eyes    Color Fundus Photography Optos - OU  - Both Eyes    3. Round hole of left eye  H33.322     4. Nuclear sclerotic cataract of both eyes  H25.13       1.  OS with new retinal hole inferior at 6:00 meridian will need laser retinopexy today and prophylaxis prior to planned cataract extraction with intraocular lens placement  2.  Dense cataract OU follow-up Dr. Baldemar Lenis as scheduled  3.  Laser retinopexy completed today inferior 6:00 meridian peripherally, through dense peripheral cataract left eye  Ophthalmic Meds Ordered this visit:  No orders of the defined types were placed in this encounter.      Return in about 4 months (around 04/04/2021) for DILATE OU, COLOR FP.  There are no Patient Instructions on file for this visit.   Explained the diagnoses, plan, and follow up with the patient and they expressed understanding.  Patient expressed understanding of the importance of proper follow up care.   Clent Demark Zev Blue M.D. Diseases & Surgery of the Retina and Vitreous Retina & Diabetic [place] 12/03/20     Abbreviations: M myopia (nearsighted); A astigmatism; H hyperopia (farsighted); P presbyopia; Mrx spectacle prescription;  CTL contact lenses; OD right eye; OS left eye; OU both eyes  XT exotropia; ET esotropia; PEK punctate epithelial keratitis; PEE punctate epithelial erosions; DES dry eye syndrome; MGD meibomian gland dysfunction; ATs artificial tears; PFAT's preservative free artificial tears; [place] nuclear sclerotic cataract; PSC posterior subcapsular cataract; ERM epi-retinal membrane; PVD posterior vitreous detachment; RD retinal detachment; DM diabetes mellitus; DR diabetic retinopathy; NPDR non-proliferative diabetic retinopathy; PDR proliferative diabetic retinopathy; CSME clinically significant macular edema; DME diabetic macular edema; dbh dot blot hemorrhages; CWS cotton wool spot; POAG primary open angle glaucoma; C/D cup-to-disc ratio; HVF humphrey visual field; GVF goldmann visual field; OCT optical  coherence tomography; IOP intraocular pressure; BRVO Branch retinal vein occlusion; CRVO central retinal vein occlusion; CRAO central retinal artery occlusion; BRAO branch retinal artery occlusion; RT retinal tear; SB scleral buckle; PPV pars plana vitrectomy; VH Vitreous hemorrhage; PRP panretinal laser photocoagulation; IVK intravitreal kenalog; VMT vitreomacular traction; MH Macular hole;  NVD neovascularization of the disc; NVE neovascularization elsewhere; AREDS age related eye disease study; ARMD age related macular degeneration; POAG primary open angle glaucoma; EBMD epithelial/anterior basement membrane dystrophy; ACIOL anterior chamber intraocular lens; IOL intraocular lens; PCIOL posterior chamber intraocular lens; Phaco/IOL phacoemulsification with intraocular lens placement; [place] photorefractive keratectomy; LASIK laser assisted in situ keratomileusis; HTN hypertension; DM diabetes mellitus; COPD  chronic obstructive pulmonary disease  

## 2020-12-12 DIAGNOSIS — H2513 Age-related nuclear cataract, bilateral: Secondary | ICD-10-CM | POA: Diagnosis not present

## 2020-12-12 DIAGNOSIS — H40013 Open angle with borderline findings, low risk, bilateral: Secondary | ICD-10-CM | POA: Diagnosis not present

## 2020-12-12 DIAGNOSIS — H35413 Lattice degeneration of retina, bilateral: Secondary | ICD-10-CM | POA: Diagnosis not present

## 2020-12-12 DIAGNOSIS — H25013 Cortical age-related cataract, bilateral: Secondary | ICD-10-CM | POA: Diagnosis not present

## 2021-01-03 ENCOUNTER — Encounter (INDEPENDENT_AMBULATORY_CARE_PROVIDER_SITE_OTHER): Payer: Medicare Other | Admitting: Ophthalmology

## 2021-02-05 DIAGNOSIS — H25041 Posterior subcapsular polar age-related cataract, right eye: Secondary | ICD-10-CM | POA: Diagnosis not present

## 2021-02-05 DIAGNOSIS — H2511 Age-related nuclear cataract, right eye: Secondary | ICD-10-CM | POA: Diagnosis not present

## 2021-02-05 DIAGNOSIS — H25811 Combined forms of age-related cataract, right eye: Secondary | ICD-10-CM | POA: Diagnosis not present

## 2021-03-16 DIAGNOSIS — H524 Presbyopia: Secondary | ICD-10-CM | POA: Diagnosis not present

## 2021-04-04 ENCOUNTER — Encounter (INDEPENDENT_AMBULATORY_CARE_PROVIDER_SITE_OTHER): Payer: Medicare Other | Admitting: Ophthalmology

## 2021-04-11 ENCOUNTER — Encounter (INDEPENDENT_AMBULATORY_CARE_PROVIDER_SITE_OTHER): Payer: Medicare Other | Admitting: Ophthalmology

## 2021-04-15 DIAGNOSIS — U071 COVID-19: Secondary | ICD-10-CM | POA: Diagnosis not present

## 2021-04-16 ENCOUNTER — Encounter (INDEPENDENT_AMBULATORY_CARE_PROVIDER_SITE_OTHER): Payer: Medicare Other | Admitting: Ophthalmology

## 2021-05-13 ENCOUNTER — Ambulatory Visit (INDEPENDENT_AMBULATORY_CARE_PROVIDER_SITE_OTHER): Payer: Medicare Other | Admitting: Ophthalmology

## 2021-05-13 ENCOUNTER — Other Ambulatory Visit: Payer: Self-pay

## 2021-05-13 ENCOUNTER — Encounter (INDEPENDENT_AMBULATORY_CARE_PROVIDER_SITE_OTHER): Payer: Self-pay | Admitting: Ophthalmology

## 2021-05-13 DIAGNOSIS — H2512 Age-related nuclear cataract, left eye: Secondary | ICD-10-CM | POA: Diagnosis not present

## 2021-05-13 DIAGNOSIS — Z961 Presence of intraocular lens: Secondary | ICD-10-CM | POA: Insufficient documentation

## 2021-05-13 DIAGNOSIS — H35411 Lattice degeneration of retina, right eye: Secondary | ICD-10-CM | POA: Diagnosis not present

## 2021-05-13 DIAGNOSIS — H33322 Round hole, left eye: Secondary | ICD-10-CM

## 2021-05-13 DIAGNOSIS — H33022 Retinal detachment with multiple breaks, left eye: Secondary | ICD-10-CM | POA: Diagnosis not present

## 2021-05-13 NOTE — Assessment & Plan Note (Signed)
Good retinopexy no new breaks 

## 2021-05-13 NOTE — Assessment & Plan Note (Signed)
Moderate nuclear sclerotic changes, observe, follow-up Dr. Herbert Deaner

## 2021-05-13 NOTE — Assessment & Plan Note (Signed)
Looks great!

## 2021-05-13 NOTE — Progress Notes (Signed)
05/13/2021     CHIEF COMPLAINT Patient presents for  Chief Complaint  Patient presents with   Retina Follow Up    Retinopexy OD and FP Pt denies issues or complaints.      HISTORY OF PRESENT ILLNESS: Daniel Velasquez is a 71 y.o. male who presents to the clinic today for:   HPI     Retina Follow Up           Diagnosis: Lattice Degeneration   Laterality: right eye   Onset: 5 months ago   Severity: moderate   Duration: 5 months   Course: stable   MD Performed: performed the HPI with the patient and updated documentation appropriately   Comments: Retinopexy OD and FP Pt denies issues or complaints.         Comments   4 mos fu OU oct fp. Pt states he had cataract surgery in October with Dr. Herbert Deaner, his vision has improved since cataract surgery. Pt uses Restasis only currently, every other day OU.      Last edited by Laurin Coder on 05/13/2021  8:22 AM.      Referring physician: Alroy Dust, [address], [place],  [place] [address]  HISTORICAL INFORMATION:   Selected notes from the MEDICAL RECORD NUMBER       CURRENT MEDICATIONS: Current Outpatient Medications (Ophthalmic Drugs)  Medication Sig   cycloSPORINE (RESTASIS) 0.05 % ophthalmic emulsion 1 drop in each eye   No current facility-administered medications for this visit. (Ophthalmic Drugs)   No current outpatient medications on file. (Other)   No current facility-administered medications for this visit. (Other)      REVIEW OF SYSTEMS:    ALLERGIES Allergies  Allergen Reactions   Ibuprofen     PAST MEDICAL HISTORY History reviewed. No pertinent past medical history. History reviewed. No pertinent surgical history.  FAMILY HISTORY History reviewed. No pertinent family history.  SOCIAL HISTORY Social History   Tobacco Use   Smoking status: Light Smoker         OPHTHALMIC EXAM:  Base Eye Exam     Visual Acuity (ETDRS)       Right Left   Dist cc  20/20 -1 20/30 -2    Correction: Glasses         Tonometry (Tonopen, 8:26 AM)       Right Left   Pressure 17 18         Pupils       Pupils Dark Light APD   Right PERRL 4 3 None   Left PERRL 4 3 None         Visual Fields (Counting fingers)       Left Right    Full Full         Extraocular Movement       Right Left    Full Full         Neuro/Psych     Oriented x3: Yes   Mood/Affect: Normal         Dilation     Both eyes: 1.0% Mydriacyl, 2.5% Phenylephrine @ 8:26 AM           Slit Lamp and Fundus Exam     External Exam       Right Left   External Normal Normal         Slit Lamp Exam       Right Left   Lids/Lashes Normal Normal   Conjunctiva/Sclera White and  quiet White and quiet   Cornea Clear Clear   Anterior Chamber Deep and quiet Deep and quiet   Iris Round and reactive Round and reactive   Lens 3+ Nuclear sclerosis 2+ Nuclear sclerosis   Anterior Vitreous Normal Normal         Fundus Exam       Right Left   Posterior Vitreous Posterior vitreous detachment Posterior vitreous detachment, Central vitreous floaters   Disc Normal Normal   C/D Ratio 0.4 0.45   Macula Normal Normal   Vessels Normal Normal   Periphery Degeneration inferotemporal at 7 and 8 position, no breaks Good buckle, attached 360, good cryo superiorlyNew atrophic: 6:00 meridian            IMAGING AND PROCEDURES  Imaging and Procedures for 05/13/21  OCT, Retina - OU - Both Eyes       Right Eye Quality was good. Scan locations included subfoveal. Central Foveal Thickness: 307. Progression has been stable. Findings include normal foveal contour.   Left Eye Quality was good. Scan locations included subfoveal. Central Foveal Thickness: 301. Progression has been stable. Findings include normal foveal contour.   Notes Cloudy media OU     Color Fundus Photography Optos - OU - Both Eyes       Right Eye Progression has been stable. Disc  findings include normal observations. Macula : normal observations. Vessels : normal observations. Periphery : lattice.   Left Eye Progression has been stable. Disc findings include normal observations. Macula : normal observations. Vessels : normal observations.   Notes Lattice degeneration 7-8 o'clock meridian, seen clinically not well depicted on color fundus photography today, Vitreous debris noted.  Retina attached  No retinal hole 6:00 meridian             ASSESSMENT/PLAN:  Nuclear sclerotic cataract of left eye Moderate nuclear sclerotic changes, observe, follow-up Dr. Herbert Deaner  Pseudophakia, right eye Looks great  Lattice degeneration, right eye Good retinopexy no new breaks  Round hole of left eye Good retinopexy no new breaks     ICD-10-CM   1. Retinal detachment of left eye with multiple breaks  H33.022 OCT, Retina - OU - Both Eyes    Color Fundus Photography Optos - OU - Both Eyes    2. Lattice degeneration, right eye  H35.411 OCT, Retina - OU - Both Eyes    3. Nuclear sclerotic cataract of left eye  H25.12     4. Pseudophakia, right eye  Z96.1     5. Round hole of left eye  H33.322       1.  OU no new retinal breaks we will continue to monitor and follow  2.  Patient to report any new onset symptoms retinal tear or detachment including floaters flashes or curtain of darkness  3.  Ophthalmic Meds Ordered this visit:  No orders of the defined types were placed in this encounter.      Return in about 1 year (around 05/13/2022) for DILATE OU, COLOR FP, OCT.  There are no Patient Instructions on file for this visit.   Explained the diagnoses, plan, and follow up with the patient and they expressed understanding.  Patient expressed understanding of the importance of proper follow up care.   Clent Demark Ceanna Wareing M.D. Diseases & Surgery of the Retina and Vitreous Retina & Diabetic [place] 05/13/21     Abbreviations: M myopia (nearsighted); A  astigmatism; H hyperopia (farsighted); P presbyopia; Mrx spectacle prescription;  CTL contact lenses;  OD right eye; OS left eye; OU both eyes  XT exotropia; ET esotropia; PEK punctate epithelial keratitis; PEE punctate epithelial erosions; DES dry eye syndrome; MGD meibomian gland dysfunction; ATs artificial tears; PFAT's preservative free artificial tears; [place] nuclear sclerotic cataract; PSC posterior subcapsular cataract; ERM epi-retinal membrane; PVD posterior vitreous detachment; RD retinal detachment; DM diabetes mellitus; DR diabetic retinopathy; NPDR non-proliferative diabetic retinopathy; PDR proliferative diabetic retinopathy; CSME clinically significant macular edema; DME diabetic macular edema; dbh dot blot hemorrhages; CWS cotton wool spot; POAG primary open angle glaucoma; C/D cup-to-disc ratio; HVF humphrey visual field; GVF goldmann visual field; OCT optical coherence tomography; IOP intraocular pressure; BRVO Branch retinal vein occlusion; CRVO central retinal vein occlusion; CRAO central retinal artery occlusion; BRAO branch retinal artery occlusion; RT retinal tear; SB scleral buckle; PPV pars plana vitrectomy; VH Vitreous hemorrhage; PRP panretinal laser photocoagulation; IVK intravitreal kenalog; VMT vitreomacular traction; MH Macular hole;  NVD neovascularization of the disc; NVE neovascularization elsewhere; AREDS age related eye disease study; ARMD age related macular degeneration; POAG primary open angle glaucoma; EBMD epithelial/anterior basement membrane dystrophy; ACIOL anterior chamber intraocular lens; IOL intraocular lens; PCIOL posterior chamber intraocular lens; Phaco/IOL phacoemulsification with intraocular lens placement; [place] photorefractive keratectomy; LASIK laser assisted in situ keratomileusis; HTN hypertension; DM diabetes mellitus; COPD chronic obstructive pulmonary disease

## 2021-06-18 DIAGNOSIS — D485 Neoplasm of uncertain behavior of skin: Secondary | ICD-10-CM | POA: Diagnosis not present

## 2021-06-18 DIAGNOSIS — L988 Other specified disorders of the skin and subcutaneous tissue: Secondary | ICD-10-CM | POA: Diagnosis not present

## 2021-06-18 DIAGNOSIS — Z85828 Personal history of other malignant neoplasm of skin: Secondary | ICD-10-CM | POA: Diagnosis not present

## 2021-09-02 DIAGNOSIS — H40013 Open angle with borderline findings, low risk, bilateral: Secondary | ICD-10-CM | POA: Diagnosis not present

## 2021-11-07 DIAGNOSIS — Z85828 Personal history of other malignant neoplasm of skin: Secondary | ICD-10-CM | POA: Diagnosis not present

## 2021-11-07 DIAGNOSIS — L570 Actinic keratosis: Secondary | ICD-10-CM | POA: Diagnosis not present

## 2021-11-07 DIAGNOSIS — L565 Disseminated superficial actinic porokeratosis (DSAP): Secondary | ICD-10-CM | POA: Diagnosis not present

## 2021-11-07 DIAGNOSIS — L603 Nail dystrophy: Secondary | ICD-10-CM | POA: Diagnosis not present

## 2021-12-12 DIAGNOSIS — E7800 Pure hypercholesterolemia, unspecified: Secondary | ICD-10-CM | POA: Diagnosis not present

## 2021-12-12 DIAGNOSIS — M5459 Other low back pain: Secondary | ICD-10-CM | POA: Diagnosis not present

## 2021-12-12 DIAGNOSIS — R059 Cough, unspecified: Secondary | ICD-10-CM | POA: Diagnosis not present

## 2021-12-30 DIAGNOSIS — H2512 Age-related nuclear cataract, left eye: Secondary | ICD-10-CM | POA: Diagnosis not present

## 2021-12-30 DIAGNOSIS — H33312 Horseshoe tear of retina without detachment, left eye: Secondary | ICD-10-CM | POA: Diagnosis not present

## 2021-12-30 DIAGNOSIS — H26491 Other secondary cataract, right eye: Secondary | ICD-10-CM | POA: Diagnosis not present

## 2021-12-30 DIAGNOSIS — H40013 Open angle with borderline findings, low risk, bilateral: Secondary | ICD-10-CM | POA: Diagnosis not present

## 2022-01-03 DIAGNOSIS — H524 Presbyopia: Secondary | ICD-10-CM | POA: Diagnosis not present

## 2022-05-13 ENCOUNTER — Encounter (INDEPENDENT_AMBULATORY_CARE_PROVIDER_SITE_OTHER): Payer: Medicare Other | Admitting: Ophthalmology

## 2022-05-28 DIAGNOSIS — E7800 Pure hypercholesterolemia, unspecified: Secondary | ICD-10-CM | POA: Diagnosis not present

## 2022-05-28 DIAGNOSIS — Z79899 Other long term (current) drug therapy: Secondary | ICD-10-CM | POA: Diagnosis not present

## 2022-05-28 DIAGNOSIS — Z125 Encounter for screening for malignant neoplasm of prostate: Secondary | ICD-10-CM | POA: Diagnosis not present

## 2022-05-28 DIAGNOSIS — Z0000 Encounter for general adult medical examination without abnormal findings: Secondary | ICD-10-CM | POA: Diagnosis not present

## 2022-05-28 DIAGNOSIS — M545 Low back pain, unspecified: Secondary | ICD-10-CM | POA: Diagnosis not present

## 2022-07-10 DIAGNOSIS — K080 Exfoliation of teeth due to systemic causes: Secondary | ICD-10-CM | POA: Diagnosis not present

## 2022-07-11 DIAGNOSIS — K080 Exfoliation of teeth due to systemic causes: Secondary | ICD-10-CM | POA: Diagnosis not present

## 2022-08-27 DIAGNOSIS — H35411 Lattice degeneration of retina, right eye: Secondary | ICD-10-CM | POA: Diagnosis not present

## 2022-08-27 DIAGNOSIS — H33022 Retinal detachment with multiple breaks, left eye: Secondary | ICD-10-CM | POA: Diagnosis not present

## 2022-08-27 DIAGNOSIS — H33322 Round hole, left eye: Secondary | ICD-10-CM | POA: Diagnosis not present

## 2022-08-27 DIAGNOSIS — H35372 Puckering of macula, left eye: Secondary | ICD-10-CM | POA: Diagnosis not present

## 2022-08-27 DIAGNOSIS — H2512 Age-related nuclear cataract, left eye: Secondary | ICD-10-CM | POA: Diagnosis not present

## 2022-09-04 DIAGNOSIS — H33022 Retinal detachment with multiple breaks, left eye: Secondary | ICD-10-CM | POA: Diagnosis not present

## 2022-09-04 DIAGNOSIS — H33322 Round hole, left eye: Secondary | ICD-10-CM | POA: Diagnosis not present

## 2022-09-09 DIAGNOSIS — H40013 Open angle with borderline findings, low risk, bilateral: Secondary | ICD-10-CM | POA: Diagnosis not present

## 2022-10-01 DIAGNOSIS — K080 Exfoliation of teeth due to systemic causes: Secondary | ICD-10-CM | POA: Diagnosis not present

## 2022-10-23 DIAGNOSIS — L0889 Other specified local infections of the skin and subcutaneous tissue: Secondary | ICD-10-CM | POA: Diagnosis not present

## 2022-11-11 DIAGNOSIS — L570 Actinic keratosis: Secondary | ICD-10-CM | POA: Diagnosis not present

## 2022-11-11 DIAGNOSIS — D225 Melanocytic nevi of trunk: Secondary | ICD-10-CM | POA: Diagnosis not present

## 2022-11-11 DIAGNOSIS — Z85828 Personal history of other malignant neoplasm of skin: Secondary | ICD-10-CM | POA: Diagnosis not present

## 2022-11-11 DIAGNOSIS — D692 Other nonthrombocytopenic purpura: Secondary | ICD-10-CM | POA: Diagnosis not present

## 2022-11-11 DIAGNOSIS — D2262 Melanocytic nevi of left upper limb, including shoulder: Secondary | ICD-10-CM | POA: Diagnosis not present

## 2022-12-02 DIAGNOSIS — M545 Low back pain, unspecified: Secondary | ICD-10-CM | POA: Diagnosis not present

## 2022-12-02 DIAGNOSIS — R972 Elevated prostate specific antigen [PSA]: Secondary | ICD-10-CM | POA: Diagnosis not present

## 2022-12-02 DIAGNOSIS — Z23 Encounter for immunization: Secondary | ICD-10-CM | POA: Diagnosis not present

## 2022-12-02 DIAGNOSIS — E7800 Pure hypercholesterolemia, unspecified: Secondary | ICD-10-CM | POA: Diagnosis not present

## 2022-12-09 ENCOUNTER — Other Ambulatory Visit: Payer: Self-pay | Admitting: Urology

## 2022-12-09 DIAGNOSIS — R972 Elevated prostate specific antigen [PSA]: Secondary | ICD-10-CM

## 2022-12-09 DIAGNOSIS — R350 Frequency of micturition: Secondary | ICD-10-CM | POA: Diagnosis not present

## 2022-12-09 DIAGNOSIS — N401 Enlarged prostate with lower urinary tract symptoms: Secondary | ICD-10-CM | POA: Diagnosis not present

## 2022-12-16 DIAGNOSIS — H524 Presbyopia: Secondary | ICD-10-CM | POA: Diagnosis not present

## 2022-12-24 DIAGNOSIS — K080 Exfoliation of teeth due to systemic causes: Secondary | ICD-10-CM | POA: Diagnosis not present

## 2023-01-08 DIAGNOSIS — H35372 Puckering of macula, left eye: Secondary | ICD-10-CM | POA: Diagnosis not present

## 2023-01-08 DIAGNOSIS — H2512 Age-related nuclear cataract, left eye: Secondary | ICD-10-CM | POA: Diagnosis not present

## 2023-01-08 DIAGNOSIS — H35411 Lattice degeneration of retina, right eye: Secondary | ICD-10-CM | POA: Diagnosis not present

## 2023-01-08 DIAGNOSIS — H33322 Round hole, left eye: Secondary | ICD-10-CM | POA: Diagnosis not present

## 2023-01-08 DIAGNOSIS — H33022 Retinal detachment with multiple breaks, left eye: Secondary | ICD-10-CM | POA: Diagnosis not present

## 2023-01-14 DIAGNOSIS — K080 Exfoliation of teeth due to systemic causes: Secondary | ICD-10-CM | POA: Diagnosis not present

## 2023-01-23 ENCOUNTER — Ambulatory Visit
Admission: RE | Admit: 2023-01-23 | Discharge: 2023-01-23 | Disposition: A | Discharge status: Home / Self Care | Payer: Medicare Other | Source: Ambulatory Visit | Attending: Urology

## 2023-01-23 DIAGNOSIS — R972 Elevated prostate specific antigen [PSA]: Secondary | ICD-10-CM | POA: Diagnosis not present

## 2023-01-23 MED ORDER — GADOPICLENOL 0.5 MMOL/ML IV SOLN
8.0000 mL | Freq: Once | INTRAVENOUS | Status: AC | PRN
  Administered 2023-01-23: 8 mL via INTRAVENOUS

## 2023-02-11 DIAGNOSIS — H4322 Crystalline deposits in vitreous body, left eye: Secondary | ICD-10-CM | POA: Diagnosis not present

## 2023-02-11 DIAGNOSIS — H25812 Combined forms of age-related cataract, left eye: Secondary | ICD-10-CM | POA: Diagnosis not present

## 2023-02-11 DIAGNOSIS — H40013 Open angle with borderline findings, low risk, bilateral: Secondary | ICD-10-CM | POA: Diagnosis not present

## 2023-02-11 DIAGNOSIS — H26491 Other secondary cataract, right eye: Secondary | ICD-10-CM | POA: Diagnosis not present

## 2023-02-11 DIAGNOSIS — H524 Presbyopia: Secondary | ICD-10-CM | POA: Diagnosis not present

## 2023-02-18 DIAGNOSIS — M25562 Pain in left knee: Secondary | ICD-10-CM | POA: Diagnosis not present

## 2023-02-24 DIAGNOSIS — M25562 Pain in left knee: Secondary | ICD-10-CM | POA: Diagnosis not present

## 2023-02-25 DIAGNOSIS — K080 Exfoliation of teeth due to systemic causes: Secondary | ICD-10-CM | POA: Diagnosis not present

## 2023-03-03 DIAGNOSIS — M25562 Pain in left knee: Secondary | ICD-10-CM | POA: Diagnosis not present

## 2023-03-16 DIAGNOSIS — H40013 Open angle with borderline findings, low risk, bilateral: Secondary | ICD-10-CM | POA: Diagnosis not present

## 2023-03-16 DIAGNOSIS — H25812 Combined forms of age-related cataract, left eye: Secondary | ICD-10-CM | POA: Diagnosis not present

## 2023-05-04 DIAGNOSIS — H40013 Open angle with borderline findings, low risk, bilateral: Secondary | ICD-10-CM | POA: Diagnosis not present

## 2023-05-12 DIAGNOSIS — J302 Other seasonal allergic rhinitis: Secondary | ICD-10-CM | POA: Diagnosis not present

## 2023-05-12 DIAGNOSIS — R972 Elevated prostate specific antigen [PSA]: Secondary | ICD-10-CM | POA: Diagnosis not present

## 2023-05-21 DIAGNOSIS — K080 Exfoliation of teeth due to systemic causes: Secondary | ICD-10-CM | POA: Diagnosis not present

## 2023-05-25 DIAGNOSIS — K080 Exfoliation of teeth due to systemic causes: Secondary | ICD-10-CM | POA: Diagnosis not present

## 2023-07-09 DIAGNOSIS — E7800 Pure hypercholesterolemia, unspecified: Secondary | ICD-10-CM | POA: Diagnosis not present

## 2023-07-09 DIAGNOSIS — R7309 Other abnormal glucose: Secondary | ICD-10-CM | POA: Diagnosis not present

## 2023-07-09 DIAGNOSIS — Z130 Encounter for screening for diseases of the blood and blood-forming organs and certain disorders involving the immune mechanism: Secondary | ICD-10-CM | POA: Diagnosis not present

## 2023-07-13 DIAGNOSIS — Z0000 Encounter for general adult medical examination without abnormal findings: Secondary | ICD-10-CM | POA: Diagnosis not present

## 2023-07-13 DIAGNOSIS — M545 Low back pain, unspecified: Secondary | ICD-10-CM | POA: Diagnosis not present

## 2023-07-13 DIAGNOSIS — J302 Other seasonal allergic rhinitis: Secondary | ICD-10-CM | POA: Diagnosis not present

## 2023-07-13 DIAGNOSIS — E7800 Pure hypercholesterolemia, unspecified: Secondary | ICD-10-CM | POA: Diagnosis not present

## 2023-07-13 DIAGNOSIS — R7309 Other abnormal glucose: Secondary | ICD-10-CM | POA: Diagnosis not present

## 2023-10-05 DIAGNOSIS — H40013 Open angle with borderline findings, low risk, bilateral: Secondary | ICD-10-CM | POA: Diagnosis not present

## 2023-11-13 DIAGNOSIS — Z85828 Personal history of other malignant neoplasm of skin: Secondary | ICD-10-CM | POA: Diagnosis not present

## 2023-11-13 DIAGNOSIS — L814 Other melanin hyperpigmentation: Secondary | ICD-10-CM | POA: Diagnosis not present

## 2023-11-13 DIAGNOSIS — L821 Other seborrheic keratosis: Secondary | ICD-10-CM | POA: Diagnosis not present

## 2023-11-13 DIAGNOSIS — L570 Actinic keratosis: Secondary | ICD-10-CM | POA: Diagnosis not present

## 2023-11-13 DIAGNOSIS — D225 Melanocytic nevi of trunk: Secondary | ICD-10-CM | POA: Diagnosis not present

## 2023-11-20 DIAGNOSIS — J302 Other seasonal allergic rhinitis: Secondary | ICD-10-CM | POA: Diagnosis not present

## 2023-11-20 DIAGNOSIS — R7303 Prediabetes: Secondary | ICD-10-CM | POA: Diagnosis not present

## 2023-11-20 DIAGNOSIS — E7800 Pure hypercholesterolemia, unspecified: Secondary | ICD-10-CM | POA: Diagnosis not present

## 2023-11-20 DIAGNOSIS — R972 Elevated prostate specific antigen [PSA]: Secondary | ICD-10-CM | POA: Diagnosis not present

## 2023-12-14 DIAGNOSIS — K080 Exfoliation of teeth due to systemic causes: Secondary | ICD-10-CM | POA: Diagnosis not present

## 2024-02-05 ENCOUNTER — Ambulatory Visit: Payer: Self-pay | Admitting: General Surgery

## 2024-02-05 DIAGNOSIS — K4090 Unilateral inguinal hernia, without obstruction or gangrene, not specified as recurrent: Secondary | ICD-10-CM | POA: Diagnosis not present

## 2024-02-05 NOTE — Progress Notes (Signed)
 REFERRING PHYSICIAN:  Leonel Cole, MD PROVIDER:  DEWARD GARNETTE NULL, MD MRN: [id number] DOB: Jun 05, 1950 DATE OF ENCOUNTER: 02/05/2024 Subjective    Chief Complaint: Inguinal Hernia   History of Present Illness: Daniel Velasquez is a 73 y.o. male who is seen today as an office consultation for evaluation of Inguinal Hernia   We are asked to see the patient in consultation by Dr. Cole Leonel to evaluate him for a right inguinal hernia.  The patient is a 73 year old white male who has known about a right inguinal hernia for several years.  During that time it has gradually gotten larger.  He now notices some discomfort associated with it at times.  He denies any nausea or vomiting.  He is otherwise in good health and does not smoke.    Review of Systems: A complete review of systems was obtained from the patient.  I have reviewed this information and discussed as appropriate with the patient.  See HPI as well for other ROS.  ROS   Medical History: History reviewed. No pertinent past medical history.  Patient Active Problem List  Diagnosis  . Right inguinal hernia    History reviewed. No pertinent surgical history.   Allergies  Allergen Reactions  . Ibuprofen Unknown    Current Outpatient Medications on File Prior to Visit  Medication Sig Dispense Refill  . ketoconazole (NIZORAL) 2 % shampoo APPLY EXTERNALLY TO WET HAIR, LATHER, AND RINSE THOROUGHLY, REPEAT. USE EVERY 3 TO 4 DAYS FOR UP TO 8 WEEKS OR AS DIRECTED BY A HEALTH CARE PROVIDER THEN APPLY ONLY AS NEEDED TO CONTROL DANDRUFF    . pentazocine-naloxone (TALWIN NX) 50-0.5 mg per tablet Take 1 tablet by mouth 2 (two) times daily as needed for Pain    . cycloSPORINE (RESTASIS) 0.05 % ophthalmic emulsion 1 drop in each eye     No current facility-administered medications on file prior to visit.    Family History  Problem Relation Age of Onset  . Skin cancer Sister   . Deep vein thrombosis (DVT or abnormal blood clot  formation) Brother      Social History   Tobacco Use  Smoking Status Never  Smokeless Tobacco Never     Social History   Socioeconomic History  . Marital status: Married  Tobacco Use  . Smoking status: Never  . Smokeless tobacco: Never  Substance and Sexual Activity  . Drug use: Never   Social Drivers of Health   Housing Stability: Unknown (02/05/2024)   Housing Stability Vital Sign   . Homeless in the Last Year: No    Objective:   Vitals:   02/05/24 0844  BP: 132/82  Pulse: 64  Temp: 36.8 C (98.2 F)  SpO2: 97%  Weight: 86 kg (189 lb 9.6 oz)  Height: 179.1 cm (5' 10.5)  PainSc: 0-No pain    Body mass index is 26.82 kg/m.  Physical Exam Constitutional:      General: He is not in acute distress.    Appearance: Normal appearance.  HENT:     Head: Normocephalic and atraumatic.     Right Ear: External ear normal.     Left Ear: External ear normal.     Nose: Nose normal.     Mouth/Throat:     Mouth: Mucous membranes are moist.     Pharynx: Oropharynx is clear.  Eyes:     General: No scleral icterus.    Extraocular Movements: Extraocular movements intact.     Conjunctiva/sclera: Conjunctivae  normal.     Pupils: Pupils are equal, round, and reactive to light.  Cardiovascular:     Rate and Rhythm: Normal rate and regular rhythm.     Pulses: Normal pulses.     Heart sounds: Normal heart sounds.  Pulmonary:     Effort: Pulmonary effort is normal. No respiratory distress.     Breath sounds: Normal breath sounds.  Abdominal:     General: Abdomen is flat. Bowel sounds are normal. There is no distension.     Palpations: Abdomen is soft.     Tenderness: There is no abdominal tenderness.  Genitourinary:    Comments: There is a moderate size bulge in the right groin that does reduce with palpation.  There is also a small impulse with straining in the left groin. Musculoskeletal:        General: No swelling or deformity. Normal range of motion.     Cervical  back: Normal range of motion and neck supple. No tenderness.  Skin:    General: Skin is warm and dry.     Coloration: Skin is not jaundiced.  Neurological:     General: No focal deficit present.     Mental Status: He is alert and oriented to person, place, and time.  Psychiatric:        Mood and Affect: Mood normal.        Behavior: Behavior normal.        Labs, Imaging and Diagnostic Testing:   Assessment and Plan:     Diagnoses and all orders for this visit:  Right inguinal hernia -     CCS Case Posting Request; Future    The patient appears to have a moderate-sized symptomatic right inguinal hernia.  Because of the risk of incarceration and strangulation I feel he would benefit from having this fixed.  He would also like to have this done.  I have discussed with him in detail the risks and benefits of the operation as well as some of the technical aspects including the use of mesh and the risk of chronic pain and he understands and wishes to proceed.  He may also have a small left inguinal hernia but he would like to watch this at this point and we can think about fixing it if it comes larger or symptomatic.  We will move forward with surgical scheduling    DEWARD GARNETTE NULL, MD    I spent a total of 45 minutes in both face-to-face and non-face-to-face activities, excluding procedures performed, for this visit on the date of this encounter.

## 2024-02-10 DIAGNOSIS — R972 Elevated prostate specific antigen [PSA]: Secondary | ICD-10-CM | POA: Diagnosis not present

## 2024-02-11 DIAGNOSIS — H33322 Round hole, left eye: Secondary | ICD-10-CM | POA: Diagnosis not present

## 2024-02-11 DIAGNOSIS — H2512 Age-related nuclear cataract, left eye: Secondary | ICD-10-CM | POA: Diagnosis not present

## 2024-02-11 DIAGNOSIS — H35372 Puckering of macula, left eye: Secondary | ICD-10-CM | POA: Diagnosis not present

## 2024-02-11 DIAGNOSIS — H43811 Vitreous degeneration, right eye: Secondary | ICD-10-CM | POA: Diagnosis not present

## 2024-03-07 DIAGNOSIS — H40013 Open angle with borderline findings, low risk, bilateral: Secondary | ICD-10-CM | POA: Diagnosis not present

## 2024-03-07 DIAGNOSIS — H25812 Combined forms of age-related cataract, left eye: Secondary | ICD-10-CM | POA: Diagnosis not present

## 2024-03-07 DIAGNOSIS — H16223 Keratoconjunctivitis sicca, not specified as Sjogren's, bilateral: Secondary | ICD-10-CM | POA: Diagnosis not present

## 2024-03-07 DIAGNOSIS — H04123 Dry eye syndrome of bilateral lacrimal glands: Secondary | ICD-10-CM | POA: Diagnosis not present

## 2024-03-15 ENCOUNTER — Other Ambulatory Visit: Payer: Self-pay

## 2024-03-15 ENCOUNTER — Encounter (HOSPITAL_BASED_OUTPATIENT_CLINIC_OR_DEPARTMENT_OTHER): Payer: Self-pay | Admitting: General Surgery

## 2024-03-18 MED ORDER — CHLORHEXIDINE GLUCONATE CLOTH 2 % EX PADS: 6.0000 | MEDICATED_PAD | Freq: Once | CUTANEOUS | Status: DC

## 2024-03-18 NOTE — Progress Notes (Signed)

## 2024-03-21 ENCOUNTER — Encounter (HOSPITAL_BASED_OUTPATIENT_CLINIC_OR_DEPARTMENT_OTHER)
Admission: RE | Disposition: A | Discharge status: Home / Self Care | Payer: Self-pay | Source: Home / Self Care | Attending: General Surgery

## 2024-03-21 ENCOUNTER — Ambulatory Visit (HOSPITAL_BASED_OUTPATIENT_CLINIC_OR_DEPARTMENT_OTHER): Admitting: Anesthesiology

## 2024-03-21 ENCOUNTER — Ambulatory Visit (HOSPITAL_BASED_OUTPATIENT_CLINIC_OR_DEPARTMENT_OTHER)
Admission: RE | Admit: 2024-03-21 | Discharge: 2024-03-21 | Disposition: A | Discharge status: Home / Self Care | Attending: General Surgery | Admitting: General Surgery

## 2024-03-21 ENCOUNTER — Other Ambulatory Visit: Payer: Self-pay

## 2024-03-21 ENCOUNTER — Encounter (HOSPITAL_BASED_OUTPATIENT_CLINIC_OR_DEPARTMENT_OTHER): Payer: Self-pay | Admitting: General Surgery

## 2024-03-21 DIAGNOSIS — K4090 Unilateral inguinal hernia, without obstruction or gangrene, not specified as recurrent: Secondary | ICD-10-CM

## 2024-03-21 DIAGNOSIS — D176 Benign lipomatous neoplasm of spermatic cord: Secondary | ICD-10-CM | POA: Diagnosis not present

## 2024-03-21 DIAGNOSIS — G8918 Other acute postprocedural pain: Secondary | ICD-10-CM | POA: Diagnosis not present

## 2024-03-21 DIAGNOSIS — Z87891 Personal history of nicotine dependence: Secondary | ICD-10-CM | POA: Diagnosis not present

## 2024-03-21 DIAGNOSIS — Z01818 Encounter for other preprocedural examination: Secondary | ICD-10-CM

## 2024-03-21 HISTORY — PX: INGUINAL HERNIA REPAIR: SHX194

## 2024-03-21 SURGERY — REPAIR, HERNIA, INGUINAL, ADULT
Anesthesia: Regional | Site: Abdomen | Laterality: Right

## 2024-03-21 MED ORDER — ROCURONIUM BROMIDE 10 MG/ML (PF) SYRINGE
PREFILLED_SYRINGE | INTRAVENOUS | Status: AC
  Filled 2024-03-21: qty 10

## 2024-03-21 MED ORDER — ACETAMINOPHEN 500 MG PO TABS: 1000.0000 mg | ORAL_TABLET | Freq: Once | ORAL | Status: DC

## 2024-03-21 MED ORDER — ROPIVACAINE HCL 5 MG/ML IJ SOLN
INTRAMUSCULAR | Status: DC | PRN
  Administered 2024-03-21: 30 mL via PERINEURAL

## 2024-03-21 MED ORDER — KETAMINE HCL 50 MG/5ML IJ SOSY
PREFILLED_SYRINGE | INTRAMUSCULAR | Status: AC
  Filled 2024-03-21: qty 5

## 2024-03-21 MED ORDER — FENTANYL CITRATE (PF) 100 MCG/2ML IJ SOLN
INTRAMUSCULAR | Status: DC | PRN
  Administered 2024-03-21 (×2): 50 ug via INTRAVENOUS

## 2024-03-21 MED ORDER — HYDROMORPHONE HCL 1 MG/ML IJ SOLN: 0.2500 mg | INTRAMUSCULAR | Status: DC | PRN

## 2024-03-21 MED ORDER — ACETAMINOPHEN 500 MG PO TABS
1000.0000 mg | ORAL_TABLET | ORAL | Status: AC
  Administered 2024-03-21: 1000 mg via ORAL

## 2024-03-21 MED ORDER — PHENYLEPHRINE 80 MCG/ML (10ML) SYRINGE FOR IV PUSH (FOR BLOOD PRESSURE SUPPORT)
PREFILLED_SYRINGE | INTRAVENOUS | Status: AC
Start: 2024-03-21 — End: 2024-03-21
  Filled 2024-03-21: qty 10

## 2024-03-21 MED ORDER — ACETAMINOPHEN 500 MG PO TABS
ORAL_TABLET | ORAL | Status: AC
Start: 2024-03-21 — End: 2024-03-21
  Filled 2024-03-21: qty 2

## 2024-03-21 MED ORDER — PHENYLEPHRINE HCL (PRESSORS) 10 MG/ML IV SOLN
INTRAVENOUS | Status: DC | PRN
  Administered 2024-03-21 (×4): 80 ug via INTRAVENOUS

## 2024-03-21 MED ORDER — LACTATED RINGERS IV SOLN: INTRAVENOUS | Status: DC

## 2024-03-21 MED ORDER — GABAPENTIN 100 MG PO CAPS
100.0000 mg | ORAL_CAPSULE | ORAL | Status: AC
  Administered 2024-03-21: 100 mg via ORAL

## 2024-03-21 MED ORDER — AMISULPRIDE (ANTIEMETIC) 5 MG/2ML IV SOLN: 10.0000 mg | Freq: Once | INTRAVENOUS | Status: DC | PRN

## 2024-03-21 MED ORDER — ONDANSETRON HCL 4 MG/2ML IJ SOLN
INTRAMUSCULAR | Status: AC
  Filled 2024-03-21: qty 2

## 2024-03-21 MED ORDER — LIDOCAINE 2% (20 MG/ML) 5 ML SYRINGE
INTRAMUSCULAR | Status: AC
  Filled 2024-03-21: qty 5

## 2024-03-21 MED ORDER — GABAPENTIN 100 MG PO CAPS
ORAL_CAPSULE | ORAL | Status: AC
  Filled 2024-03-21: qty 1

## 2024-03-21 MED ORDER — OXYCODONE HCL 5 MG PO TABS: 5.0000 mg | ORAL_TABLET | Freq: Four times a day (QID) | ORAL | 0 refills | Status: AC | PRN

## 2024-03-21 MED ORDER — PROPOFOL 10 MG/ML IV BOLUS
INTRAVENOUS | Status: DC | PRN
Start: 2024-03-21 — End: 2024-03-21
  Administered 2024-03-21: 150 mg via INTRAVENOUS
  Administered 2024-03-21: 50 mg via INTRAVENOUS

## 2024-03-21 MED ORDER — BUPIVACAINE-EPINEPHRINE (PF) 0.25% -1:200000 IJ SOLN
INTRAMUSCULAR | Status: AC
  Filled 2024-03-21: qty 30

## 2024-03-21 MED ORDER — ONDANSETRON HCL 4 MG/2ML IJ SOLN: 4.0000 mg | Freq: Once | INTRAMUSCULAR | Status: DC | PRN

## 2024-03-21 MED ORDER — SUGAMMADEX SODIUM 200 MG/2ML IV SOLN
INTRAVENOUS | Status: DC | PRN
  Administered 2024-03-21: 200 mg via INTRAVENOUS

## 2024-03-21 MED ORDER — OXYCODONE HCL 5 MG PO TABS: 5.0000 mg | ORAL_TABLET | Freq: Once | ORAL | Status: DC | PRN

## 2024-03-21 MED ORDER — FENTANYL CITRATE (PF) 100 MCG/2ML IJ SOLN
INTRAMUSCULAR | Status: AC
  Filled 2024-03-21: qty 2

## 2024-03-21 MED ORDER — LIDOCAINE HCL (CARDIAC) PF 100 MG/5ML IV SOSY
PREFILLED_SYRINGE | INTRAVENOUS | Status: DC | PRN
  Administered 2024-03-21: 40 mg via INTRAVENOUS

## 2024-03-21 MED ORDER — FENTANYL CITRATE (PF) 100 MCG/2ML IJ SOLN
100.0000 ug | Freq: Once | INTRAMUSCULAR | Status: AC
  Administered 2024-03-21: 100 ug via INTRAVENOUS

## 2024-03-21 MED ORDER — KETAMINE HCL 10 MG/ML IJ SOLN
INTRAMUSCULAR | Status: DC | PRN
  Administered 2024-03-21: 40 mg via INTRAVENOUS

## 2024-03-21 MED ORDER — ROCURONIUM BROMIDE 100 MG/10ML IV SOLN
INTRAVENOUS | Status: DC | PRN
  Administered 2024-03-21: 60 mg via INTRAVENOUS
  Administered 2024-03-21: 20 mg via INTRAVENOUS

## 2024-03-21 MED ORDER — DEXAMETHASONE SOD PHOSPHATE PF 10 MG/ML IJ SOLN
INTRAMUSCULAR | Status: DC | PRN
  Administered 2024-03-21: 10 mg via PERINEURAL

## 2024-03-21 MED ORDER — CEFAZOLIN SODIUM-DEXTROSE 2-4 GM/100ML-% IV SOLN
INTRAVENOUS | Status: AC
  Filled 2024-03-21: qty 100

## 2024-03-21 MED ORDER — ONDANSETRON HCL 4 MG/2ML IJ SOLN
INTRAMUSCULAR | Status: DC | PRN
  Administered 2024-03-21: 4 mg via INTRAVENOUS

## 2024-03-21 MED ORDER — CEFAZOLIN SODIUM-DEXTROSE 2-4 GM/100ML-% IV SOLN
2.0000 g | INTRAVENOUS | Status: AC
  Administered 2024-03-21: 2 g via INTRAVENOUS

## 2024-03-21 MED ORDER — 0.9 % SODIUM CHLORIDE (POUR BTL) OPTIME
TOPICAL | Status: DC | PRN
  Administered 2024-03-21: 1000 mL

## 2024-03-21 MED ORDER — MIDAZOLAM HCL 2 MG/2ML IJ SOLN
INTRAMUSCULAR | Status: AC
  Filled 2024-03-21: qty 2

## 2024-03-21 MED ORDER — MIDAZOLAM HCL (PF) 2 MG/2ML IJ SOLN
2.0000 mg | Freq: Once | INTRAMUSCULAR | Status: AC
  Administered 2024-03-21: 2 mg via INTRAVENOUS

## 2024-03-21 MED ORDER — OXYCODONE HCL 5 MG/5ML PO SOLN: 5.0000 mg | Freq: Once | ORAL | Status: DC | PRN

## 2024-03-21 MED ORDER — BUPIVACAINE-EPINEPHRINE 0.25% -1:200000 IJ SOLN
INTRAMUSCULAR | Status: DC | PRN
  Administered 2024-03-21: 20 mL

## 2024-03-21 SURGICAL SUPPLY — 36 items
BENZOIN TINCTURE PRP APPL 2/3 (GAUZE/BANDAGES/DRESSINGS) IMPLANT
BLADE CLIPPER SURG (BLADE) IMPLANT
BLADE HEX COATED 2.75 (ELECTRODE) ×1 IMPLANT
BLADE SURG 15 STRL LF DISP TIS (BLADE) ×1 IMPLANT
CHLORAPREP W/TINT 26 (MISCELLANEOUS) ×1 IMPLANT
COVER BACK TABLE 60X90IN (DRAPES) ×1 IMPLANT
COVER MAYO STAND STRL (DRAPES) ×1 IMPLANT
DERMABOND ADVANCED .7 DNX12 (GAUZE/BANDAGES/DRESSINGS) ×1 IMPLANT
DRAIN PENROSE .5X12 LATEX STL (DRAIN) ×1 IMPLANT
DRAPE LAPAROTOMY TRNSV 102X78 (DRAPES) ×1 IMPLANT
DRAPE UTILITY XL STRL (DRAPES) ×1 IMPLANT
ELECTRODE REM PT RTRN 9FT ADLT (ELECTROSURGICAL) ×1 IMPLANT
GLOVE BIO SURGEON STRL SZ7.5 (GLOVE) ×1 IMPLANT
GOWN STRL REUS W/ TWL LRG LVL3 (GOWN DISPOSABLE) ×2 IMPLANT
MESH ULTRAPRO 3X6 7.6X15CM (Mesh General) IMPLANT
NDL HYPO 25X1 1.5 SAFETY (NEEDLE) ×1 IMPLANT
NEEDLE HYPO 25X1 1.5 SAFETY (NEEDLE) ×1 IMPLANT
PACK BASIN DAY SURGERY FS (CUSTOM PROCEDURE TRAY) ×1 IMPLANT
PENCIL SMOKE EVACUATOR (MISCELLANEOUS) ×1 IMPLANT
SLEEVE SCD COMPRESS KNEE MED (STOCKING) ×1 IMPLANT
SOLN 0.9% NACL POUR BTL 1000ML (IV SOLUTION) ×1 IMPLANT
SPIKE FLUID TRANSFER (MISCELLANEOUS) IMPLANT
SPONGE T-LAP 18X18 ~~LOC~~+RFID (SPONGE) ×1 IMPLANT
STRIP CLOSURE SKIN 1/2X4 (GAUZE/BANDAGES/DRESSINGS) IMPLANT
SUT MON AB 4-0 PC3 18 (SUTURE) ×1 IMPLANT
SUT PROLENE 2 0 SH DA (SUTURE) ×2 IMPLANT
SUT SILK 2 0 SH (SUTURE) IMPLANT
SUT SILK 3 0 SH 30 (SUTURE) IMPLANT
SUT SILK 3-0 18XBRD TIE BLK (SUTURE) ×1 IMPLANT
SUT VIC AB 0 CT1 27XBRD ANBCTR (SUTURE) IMPLANT
SUT VIC AB 2-0 SH 27XBRD (SUTURE) ×1 IMPLANT
SUT VIC AB 3-0 SH 27X BRD (SUTURE) ×1 IMPLANT
SUT VICRYL 0 SH 27 (SUTURE) IMPLANT
SYR BULB EAR ULCER 3OZ GRN STR (SYRINGE) ×1 IMPLANT
SYR CONTROL 10ML LL (SYRINGE) ×1 IMPLANT
TOWEL GREEN STERILE FF (TOWEL DISPOSABLE) ×2 IMPLANT

## 2024-03-21 NOTE — Anesthesia Procedure Notes (Signed)
 Anesthesia Regional Block: TAP block   Pre-Anesthetic Checklist: , timeout performed,  Correct Patient, Correct Site, Correct Laterality,  Correct Procedure, Correct Position, site marked,  Risks and benefits discussed,  Surgical consent,  Pre-op evaluation,  At surgeon's request and post-op pain management  Laterality: Right  Prep: Maximum Sterile Barrier Precautions used, chloraprep       Needles:  Injection technique: Single-shot  Needle Type: Echogenic Stimulator Needle     Needle Length: 9cm  Needle Gauge: 22     Additional Needles:   Procedures:,,,, ultrasound used (permanent image in chart),,    Narrative:  Start time: 03/21/2024 9:00 AM End time: 03/21/2024 9:05 AM Injection made incrementally with aspirations every 5 mL.  Performed by: Personally  Anesthesiologist: Merla Almarie HERO, DO  Additional Notes: Monitors applied. No increased pain on injection. No increased resistance to injection. Injection made in 5cc increments. Good needle visualization. Patient tolerated procedure well.

## 2024-03-21 NOTE — Anesthesia Procedure Notes (Signed)
 Procedure Name: Intubation Date/Time: 03/21/2024 9:21 AM  Performed by: Frost Kayla MATSU, CRNAPre-anesthesia Checklist: Patient identified, Emergency Drugs available, Suction available and Patient being monitored Patient Re-evaluated:Patient Re-evaluated prior to induction Oxygen Delivery Method: Circle system utilized Preoxygenation: Pre-oxygenation with 100% oxygen Induction Type: IV induction Ventilation: Mask ventilation without difficulty Laryngoscope Size: Glidescope and 4 Grade View: Grade I Tube type: Oral Number of attempts: 2 Airway Equipment and Method: Stylet and Oral airway Placement Confirmation: ETT inserted through vocal cords under direct vision, positive ETCO2 and breath sounds checked- equal and bilateral Secured at: 22 cm Tube secured with: Tape Dental Injury: Teeth and Oropharynx as per pre-operative assessment  Comments: DL x 1 with Miller 3, grade 3 view, esophageal intubation recognized immediately, ETT removed, easy mask airway with VSS, glidescope with grade 1 view, AOI with + ETCO2,BBS =.

## 2024-03-21 NOTE — Anesthesia Postprocedure Evaluation (Signed)
 Anesthesia Post Note  Patient: Daniel Velasquez  Procedure(s) Performed: REPAIR, HERNIA, INGUINAL, ADULT (Right: Abdomen)     Patient location during evaluation: PACU Anesthesia Type: Regional and General Level of consciousness: awake and alert, oriented and patient cooperative Pain management: pain level controlled Vital Signs Assessment: post-procedure vital signs reviewed and stable Respiratory status: spontaneous breathing, nonlabored ventilation and respiratory function stable Cardiovascular status: blood pressure returned to baseline and stable Postop Assessment: no apparent nausea or vomiting Anesthetic complications: no   No notable events documented.  Last Vitals:  Vitals:   03/21/24 1130 03/21/24 1140  BP: (!) 162/94 (!) 163/92  Pulse: 76 72  Resp: 16 20  Temp:  36.9 C  SpO2: 95% 95%    Last Pain:  Vitals:   03/21/24 1140  TempSrc: Temporal  PainSc: 1                  Almarie CHRISTELLA Marchi

## 2024-03-21 NOTE — Progress Notes (Signed)
Assisted Dr. Doroteo Glassman with right, transabdominal plane, ultrasound guided block. Side rails up, monitors on throughout procedure. See vital signs in flow sheet. Tolerated Procedure well.

## 2024-03-21 NOTE — Op Note (Signed)
 03/21/2024  10:40 AM  PATIENT:  Daniel Velasquez  73 y.o. male  PRE-OPERATIVE DIAGNOSIS:  right inguinal hernia  POST-OPERATIVE DIAGNOSIS:  right inguinal hernia  PROCEDURE:  Procedure(s) with comments: REPAIR, HERNIA, INGUINAL, ADULT (Right) - right inguinal hernia repair with mesh - GENERAL AND TAP BLOCK  SURGEON:  Surgeons and Role:    * Curvin Deward MOULD, MD - Primary  PHYSICIAN ASSISTANT:   ASSISTANTS: none   ANESTHESIA:   local and general  EBL:  10 mL   BLOOD ADMINISTERED:none  DRAINS: none   LOCAL MEDICATIONS USED:  MARCAINE     SPECIMEN:  No Specimen  DISPOSITION OF SPECIMEN:  N/A  COUNTS:  YES  TOURNIQUET:  * No tourniquets in log *  DICTATION: .Dragon Dictation  After informed consent obtained the patient was brought to the operating room and placed in the supine position on the operating table.  After adequate induction of general anesthesia the patient's abdomen and right groin were prepped with ChloraPrep, allowed to dry, and draped in usual sterile manner.  An appropriate timeout was performed.  The right groin was then infiltrated with quarter percent Marcaine.  A small incision was made from the edge of the pubic tubercle on the right towards the anterior superior iliac spine with a 15 blade knife.  The incision was carried through the skin and subcutaneous tissue sharply with electrocautery until the fascia of the external oblique was encountered. .  The vein was clamped with hemostats, divided, and ligated with 3-0 silk ties.  The external oblique fascia was then opened along its fibers towards the apex of the external ring with a 15 blade knife and Metzenbaum scissors.  A Witman retractor was deployed.  Blunt dissection was carried out of the cord structures until they could be surrounded between 2 fingers.  1/2 inch Penrose drain was placed around the cord structures for retraction purposes.  There appeared to be a large lipoma of the cord which also contained  part of the hernia.  This was gently separated from the rest of the cord structures.  Care was taken to avoid any injury to the vas deferens or testicular vessels.  Once this structure was completely separated from the rest of the cord and was able to be reduced.  The floor the canal was then repaired with multiple figure-of-eight 0 Vicryl stitches.  Next a 3 x 6 piece of UltraPro mesh was chosen and cut to the appropriate size.  The mesh was sewed inferiorly to the shelving edge of the inguinal ligament with a running 2-0 Prolene stitch.  Tails were cut in the mesh laterally and the tails were wrapped around the cord structures.  Medially and superiorly the mesh was sewed to the muscular aponeurotic strength layer of the transversalis with interrupted 2-0 Prolene vertical mattress stitches.  Lateral to the cord the tails of the mesh were anchored to the shelving edge of the inguinal ligament with interrupted 2-0 Prolene stitches.  Once this was accomplished the mesh appeared to be in good position and the hernia seem well repaired.  Wound was irrigated with copious amounts of saline.  During the dissection the ilioinguinal nerve was identified and involved with some scar tissue.  It was dissected out proximally and distally, clamped, divided, and ligated with 3-0 silk ties.  Next the external oblique fascia was reapproximated with a running 2-0 Vicryl stitch.  The wound was infiltrated with more quarter percent Marcaine.  The subcutaneous fascia was closed with a  running 3-0 Vicryl stitch.  The skin was closed with a running 4-0 Monocryl subcuticular stitch.  Dermabond dressings were applied.  The patient tolerated the procedure well.  At the end of the case all needle sponge and instrument counts were correct.  The patient was then awakened and taken to recovery in stable condition.  The patient's testicle was in his scrotum at the end of the case.  PLAN OF CARE: Discharge to home after PACU  PATIENT  DISPOSITION:  PACU - hemodynamically stable.   Delay start of Pharmacological VTE agent (>24hrs) due to surgical blood loss or risk of bleeding: not applicable

## 2024-03-21 NOTE — Transfer of Care (Signed)
 Immediate Anesthesia Transfer of Care Note  Patient: Daniel Velasquez  Procedure(s) Performed: REPAIR, HERNIA, INGUINAL, ADULT (Right: Abdomen)  Patient Location: PACU  Anesthesia Type:GA combined with regional for post-op pain  Level of Consciousness: drowsy, patient cooperative, and responds to stimulation  Airway & Oxygen Therapy: Patient Spontanous Breathing and Patient connected to face mask oxygen  Post-op Assessment: Report given to RN and Post -op Vital signs reviewed and stable  Post vital signs: Reviewed and stable  Last Vitals:  Vitals Value Taken Time  BP 160/108 03/21/24 10:50  Temp 36.4 C 03/21/24 10:48  Pulse 77 03/21/24 10:52  Resp 11 03/21/24 10:52  SpO2 100 % 03/21/24 10:52  Vitals shown include unfiled device data.  Last Pain:  Vitals:   03/21/24 1048  TempSrc:   PainSc: 0-No pain      Patients Stated Pain Goal: 7 (03/21/24 0851)  Complications: No notable events documented.

## 2024-03-21 NOTE — H&P (Signed)
 REFERRING PHYSICIAN: Leonel Cole, MD PROVIDER: DEWARD GARNETTE NULL, MD MRN: [id number] DOB: 1951-05-05 Subjective   Chief Complaint: Inguinal Hernia  History of Present Illness: Daniel Velasquez is a 73 y.o. male who is seen today as an office consultation for evaluation of Inguinal Hernia  We are asked to see the patient in consultation by Dr. Cole Leonel to evaluate him for a right inguinal hernia. The patient is a 73 year old white male who has known about a right inguinal hernia for several years. During that time it has gradually gotten larger. He now notices some discomfort associated with it at times. He denies any nausea or vomiting. He is otherwise in good health and does not smoke.  Review of Systems: A complete review of systems was obtained from the patient. I have reviewed this information and discussed as appropriate with the patient. See HPI as well for other ROS.  ROS   Medical History: History reviewed. No pertinent past medical history.  Patient Active Problem List  Diagnosis  Right inguinal hernia   History reviewed. No pertinent surgical history.   Allergies  Allergen Reactions  Ibuprofen Unknown   Current Outpatient Medications on File Prior to Visit  Medication Sig Dispense Refill  ketoconazole (NIZORAL) 2 % shampoo APPLY EXTERNALLY TO WET HAIR, LATHER, AND RINSE THOROUGHLY, REPEAT. USE EVERY 3 TO 4 DAYS FOR UP TO 8 WEEKS OR AS DIRECTED BY A HEALTH CARE PROVIDER THEN APPLY ONLY AS NEEDED TO CONTROL DANDRUFF  pentazocine-naloxone (TALWIN NX) 50-0.5 mg per tablet Take 1 tablet by mouth 2 (two) times daily as needed for Pain  cycloSPORINE (RESTASIS) 0.05 % ophthalmic emulsion 1 drop in each eye   No current facility-administered medications on file prior to visit.   Family History  Problem Relation Age of Onset  Skin cancer Sister  Deep vein thrombosis (DVT or abnormal blood clot formation) Brother    Social History   Tobacco Use  Smoking Status Never   Smokeless Tobacco Never    Social History   Socioeconomic History  Marital status: Married  Tobacco Use  Smoking status: Never  Smokeless tobacco: Never  Substance and Sexual Activity  Drug use: Never   Social Drivers of Health   Housing Stability: Unknown (02/05/2024)  Housing Stability Vital Sign  Homeless in the Last Year: No   Objective:   Vitals:  BP: 132/82  Pulse: 64  Temp: 36.8 C (98.2 F)  SpO2: 97%  Weight: 86 kg (189 lb 9.6 oz)  Height: 179.1 cm (5' 10.5)  PainSc: 0-No pain   Body mass index is 26.82 kg/m.  Physical Exam Constitutional:  General: He is not in acute distress. Appearance: Normal appearance.  HENT:  Head: Normocephalic and atraumatic.  Right Ear: External ear normal.  Left Ear: External ear normal.  Nose: Nose normal.  Mouth/Throat:  Mouth: Mucous membranes are moist.  Pharynx: Oropharynx is clear.  Eyes:  General: No scleral icterus. Extraocular Movements: Extraocular movements intact.  Conjunctiva/sclera: Conjunctivae normal.  Pupils: Pupils are equal, round, and reactive to light.  Cardiovascular:  Rate and Rhythm: Normal rate and regular rhythm.  Pulses: Normal pulses.  Heart sounds: Normal heart sounds.  Pulmonary:  Effort: Pulmonary effort is normal. No respiratory distress.  Breath sounds: Normal breath sounds.  Abdominal:  General: Abdomen is flat. Bowel sounds are normal. There is no distension.  Palpations: Abdomen is soft.  Tenderness: There is no abdominal tenderness.  Genitourinary: Comments: There is a moderate size bulge in the right groin that  does reduce with palpation. There is also a small impulse with straining in the left groin. Musculoskeletal:  General: No swelling or deformity. Normal range of motion.  Cervical back: Normal range of motion and neck supple. No tenderness.  Skin: General: Skin is warm and dry.  Coloration: Skin is not jaundiced.  Neurological:  General: No focal deficit present.   Mental Status: He is alert and oriented to person, place, and time.  Psychiatric:  Mood and Affect: Mood normal.  Behavior: Behavior normal.     Labs, Imaging and Diagnostic Testing:  Assessment and Plan:   Diagnoses and all orders for this visit:  Right inguinal hernia - [facility] Case Posting Request; Future   The patient appears to have a moderate-sized symptomatic right inguinal hernia. Because of the risk of incarceration and strangulation I feel he would benefit from having this fixed. He would also like to have this done. I have discussed with him in detail the risks and benefits of the operation as well as some of the technical aspects including the use of mesh and the risk of chronic pain and he understands and wishes to proceed. He may also have a small left inguinal hernia but he would like to watch this at this point and we can think about fixing it if it comes larger or symptomatic. We will move forward with surgical scheduling

## 2024-03-21 NOTE — Interval H&P Note (Signed)
 History and Physical Interval Note:  03/21/2024 8:37 AM  Daniel Velasquez  has presented today for surgery, with the diagnosis of right inguinal hernia.  The various methods of treatment have been discussed with the patient and family. After consideration of risks, benefits and other options for treatment, the patient has consented to  Procedure(s) with comments: REPAIR, HERNIA, INGUINAL, ADULT (Right) - right inguinal hernia repair with mesh - GENERAL AND TAP BLOCK as a surgical intervention.  The patient's history has been reviewed, patient examined, no change in status, stable for surgery.  I have reviewed the patient's chart and labs.  Questions were answered to the patient's satisfaction.     Deward Null III

## 2024-03-21 NOTE — Discharge Instructions (Signed)
  Post Anesthesia Home Care Instructions  Activity: Get plenty of rest for the remainder of the day. A responsible individual must stay with you for 24 hours following the procedure.  For the next 24 hours, DO NOT: -Drive a car -Advertising copywriter -Drink alcoholic beverages -Take any medication unless instructed by your physician -Make any legal decisions or sign important papers.  Meals: Start with liquid foods such as gelatin or soup. Progress to regular foods as tolerated. Avoid greasy, spicy, heavy foods. If nausea and/or vomiting occur, drink only clear liquids until the nausea and/or vomiting subsides. Call your physician if vomiting continues.  Special Instructions/Symptoms: Your throat may feel dry or sore from the anesthesia or the breathing tube placed in your throat during surgery. If this causes discomfort, gargle with warm salt water. The discomfort should disappear within 24 hours.  If you had a scopolamine patch placed behind your ear for the management of post- operative nausea and/or vomiting:  1. The medication in the patch is effective for 72 hours, after which it should be removed.  Wrap patch in a tissue and discard in the trash. Wash hands thoroughly with soap and water. 2. You may remove the patch earlier than 72 hours if you experience unpleasant side effects which may include dry mouth, dizziness or visual disturbances. 3. Avoid touching the patch. Wash your hands with soap and water after contact with the patch.       Next dose of tylenol may be taken at 3p if needed  Next dose of ibuprofen may be taken at 3p if needed

## 2024-03-21 NOTE — Anesthesia Preprocedure Evaluation (Addendum)
 Anesthesia Evaluation  Patient identified by MRN, date of birth, ID band Patient awake    Reviewed: Allergy & Precautions, H&P , NPO status , Patient's Chart, lab work & pertinent test results  Airway Mallampati: IV  TM Distance: >3 FB Neck ROM: Full    Dental  (+) Teeth Intact, Dental Advisory Given   Pulmonary former smoker   Pulmonary exam normal breath sounds clear to auscultation       Cardiovascular negative cardio ROS Normal cardiovascular exam Rhythm:Regular Rate:Normal     Neuro/Psych negative neurological ROS  negative psych ROS   GI/Hepatic negative GI ROS, Neg liver ROS,,,  Endo/Other  negative endocrine ROS    Renal/GU negative Renal ROS  negative genitourinary   Musculoskeletal negative musculoskeletal ROS (+)    Abdominal   Peds negative pediatric ROS (+)  Hematology negative hematology ROS (+)   Anesthesia Other Findings   Reproductive/Obstetrics negative OB ROS                              Anesthesia Physical Anesthesia Plan  ASA: 2  Anesthesia Plan: General and Regional   Post-op Pain Management: Regional block* and Tylenol PO (pre-op)*   Induction: Intravenous  PONV Risk Score and Plan: 3 and Ondansetron, Dexamethasone, Midazolam and Treatment may vary due to age or medical condition  Airway Management Planned: Oral ETT  Additional Equipment: None  Intra-op Plan:   Post-operative Plan: Extubation in OR  Informed Consent: I have reviewed the patients History and Physical, chart, labs and discussed the procedure including the risks, benefits and alternatives for the proposed anesthesia with the patient or authorized representative who has indicated his/her understanding and acceptance.     Dental advisory given  Plan Discussed with: CRNA  Anesthesia Plan Comments:          Anesthesia Quick Evaluation

## 2024-03-22 ENCOUNTER — Encounter (HOSPITAL_BASED_OUTPATIENT_CLINIC_OR_DEPARTMENT_OTHER): Payer: Self-pay | Admitting: General Surgery

## 2024-03-28 DIAGNOSIS — H2512 Age-related nuclear cataract, left eye: Secondary | ICD-10-CM | POA: Diagnosis not present

## 2024-03-28 DIAGNOSIS — H43811 Vitreous degeneration, right eye: Secondary | ICD-10-CM | POA: Diagnosis not present

## 2024-03-28 DIAGNOSIS — H33322 Round hole, left eye: Secondary | ICD-10-CM | POA: Diagnosis not present

## 2024-03-28 DIAGNOSIS — H35372 Puckering of macula, left eye: Secondary | ICD-10-CM | POA: Diagnosis not present

## 2024-03-28 DIAGNOSIS — H35411 Lattice degeneration of retina, right eye: Secondary | ICD-10-CM | POA: Diagnosis not present
